# Patient Record
Sex: Male | Born: 1950 | Race: Black or African American | Hispanic: No | Marital: Married | Smoking: Current every day smoker
Health system: Southern US, Community
[De-identification: ages and names within clinical notes are randomized; demographics above are authoritative.]

## PROBLEM LIST (undated history)

## (undated) DIAGNOSIS — M542 Cervicalgia: Secondary | ICD-10-CM

## (undated) DIAGNOSIS — Z860100 Personal history of colon polyps, unspecified: Secondary | ICD-10-CM

## (undated) DIAGNOSIS — Z8669 Personal history of other diseases of the nervous system and sense organs: Secondary | ICD-10-CM

## (undated) DIAGNOSIS — R531 Weakness: Secondary | ICD-10-CM

## (undated) DIAGNOSIS — G8929 Other chronic pain: Secondary | ICD-10-CM

## (undated) DIAGNOSIS — I10 Essential (primary) hypertension: Secondary | ICD-10-CM

## (undated) DIAGNOSIS — I4891 Unspecified atrial fibrillation: Secondary | ICD-10-CM

## (undated) DIAGNOSIS — M62838 Other muscle spasm: Secondary | ICD-10-CM

## (undated) DIAGNOSIS — M549 Dorsalgia, unspecified: Secondary | ICD-10-CM

## (undated) HISTORY — DX: Other chronic pain: G89.29

## (undated) HISTORY — DX: Cervicalgia: M54.2

## (undated) HISTORY — DX: Essential (primary) hypertension: I10

## (undated) HISTORY — DX: Dorsalgia, unspecified: M54.9

## (undated) HISTORY — PX: TONSILLECTOMY: SUR1361

---

## 2000-08-23 ENCOUNTER — Encounter: Payer: Self-pay | Admitting: Family Medicine

## 2000-08-23 ENCOUNTER — Ambulatory Visit (HOSPITAL_COMMUNITY): Admission: RE | Admit: 2000-08-23 | Discharge: 2000-08-23 | Payer: Self-pay | Admitting: Family Medicine

## 2001-02-20 ENCOUNTER — Encounter: Payer: Self-pay | Admitting: Family Medicine

## 2001-02-20 ENCOUNTER — Ambulatory Visit (HOSPITAL_COMMUNITY): Admission: RE | Admit: 2001-02-20 | Discharge: 2001-02-20 | Payer: Self-pay | Admitting: Family Medicine

## 2004-05-07 ENCOUNTER — Emergency Department (HOSPITAL_COMMUNITY): Admission: EM | Admit: 2004-05-07 | Discharge: 2004-05-08 | Payer: Self-pay | Admitting: Emergency Medicine

## 2005-03-12 ENCOUNTER — Ambulatory Visit (HOSPITAL_COMMUNITY): Admission: RE | Admit: 2005-03-12 | Discharge: 2005-03-12 | Payer: Self-pay | Admitting: Family Medicine

## 2009-08-17 ENCOUNTER — Encounter: Admission: RE | Admit: 2009-08-17 | Discharge: 2009-08-17 | Payer: Self-pay | Admitting: Occupational Medicine

## 2010-12-25 ENCOUNTER — Emergency Department (HOSPITAL_COMMUNITY)
Admission: EM | Admit: 2010-12-25 | Discharge: 2010-12-25 | Disposition: A | Discharge status: Home / Self Care | Payer: Self-pay | Attending: Emergency Medicine | Admitting: Emergency Medicine

## 2010-12-25 ENCOUNTER — Encounter: Payer: Self-pay | Admitting: *Deleted

## 2010-12-25 ENCOUNTER — Other Ambulatory Visit: Payer: Self-pay

## 2010-12-25 ENCOUNTER — Emergency Department (HOSPITAL_COMMUNITY): Payer: Self-pay

## 2010-12-25 DIAGNOSIS — R1013 Epigastric pain: Secondary | ICD-10-CM | POA: Insufficient documentation

## 2010-12-25 DIAGNOSIS — F17200 Nicotine dependence, unspecified, uncomplicated: Secondary | ICD-10-CM | POA: Insufficient documentation

## 2010-12-25 DIAGNOSIS — W010XXA Fall on same level from slipping, tripping and stumbling without subsequent striking against object, initial encounter: Secondary | ICD-10-CM | POA: Insufficient documentation

## 2010-12-25 DIAGNOSIS — M7918 Myalgia, other site: Secondary | ICD-10-CM

## 2010-12-25 DIAGNOSIS — IMO0001 Reserved for inherently not codable concepts without codable children: Secondary | ICD-10-CM | POA: Insufficient documentation

## 2010-12-25 DIAGNOSIS — Z9181 History of falling: Secondary | ICD-10-CM

## 2010-12-25 DIAGNOSIS — M549 Dorsalgia, unspecified: Secondary | ICD-10-CM | POA: Insufficient documentation

## 2010-12-25 LAB — CBC
Hemoglobin: 14.9 g/dL (ref 13.0–17.0)
MCH: 32.4 pg (ref 26.0–34.0)
MCV: 93 fL (ref 78.0–100.0)
RBC: 4.6 MIL/uL (ref 4.22–5.81)

## 2010-12-25 LAB — BASIC METABOLIC PANEL
CO2: 25 mEq/L (ref 19–32)
Glucose, Bld: 81 mg/dL (ref 70–99)
Potassium: 3.6 mEq/L (ref 3.5–5.1)
Sodium: 140 mEq/L (ref 135–145)

## 2010-12-25 MED ORDER — IOHEXOL 300 MG/ML  SOLN
100.0000 mL | Freq: Once | INTRAMUSCULAR | Status: AC | PRN
  Administered 2010-12-25: 100 mL via INTRAVENOUS

## 2010-12-25 MED ORDER — HYDROCODONE-ACETAMINOPHEN 5-325 MG PO TABS: 1.0000 | ORAL_TABLET | ORAL | Status: AC | PRN

## 2010-12-25 MED ORDER — IBUPROFEN 600 MG PO TABS: 600.0000 mg | ORAL_TABLET | Freq: Four times a day (QID) | ORAL | Status: AC | PRN

## 2010-12-25 NOTE — ED Notes (Signed)
States slipped on wet substance and c/o pain to right lower back, right hip and right leg.  Also c/o chest pain.  Denies hitting head/LOC.

## 2010-12-25 NOTE — ED Notes (Signed)
Pt taken to ct. Nad.  
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
## 2011-01-01 LAB — SAMPLE TO BLOOD BANK

## 2011-01-01 NOTE — ED Provider Notes (Signed)
History     CSN: [id number] Arrival date & time: 12/25/2010  5:34 PM   Chief Complaint  Patient presents with  . Fall     (Include location/radiation/quality/duration/timing/severity/associated sxs/prior treatment) Patient is a 60 y.o. male presenting with fall. The history is provided by the patient.  Fall The accident occurred less than 1 hour ago (Also developed chest pain,  stating he may have pulled a chest muscle when he tried to grab a shelf with his right arm.Marland Kitchen He denies hitting his head.). The fall occurred while walking (He was walking through an auto parts store when he slipped in an unknown substance,  causing pain in his right lower back,  hip and leg.  ). He landed on a hard floor. The point of impact was the right hip. The pain is at a severity of 8/10. The pain is moderate. He was ambulatory at the scene. Pertinent negatives include no visual change, no fever, no numbness, no abdominal pain, no nausea, no headaches and no loss of consciousness. The symptoms are aggravated by activity. He has tried nothing for the symptoms.     History reviewed. No pertinent past medical history.   History reviewed. No pertinent past surgical history.  No family history on file.  History  Substance Use Topics  . Smoking status: Current Everyday Smoker    Types: Cigarettes  . Smokeless tobacco: Not on file  . Alcohol Use: Yes     occasional      Review of Systems  Constitutional: Negative for fever.  HENT: Negative for congestion, sore throat and neck pain.   Eyes: Negative.   Respiratory: Negative for chest tightness and shortness of breath.   Cardiovascular: Negative for chest pain.  Gastrointestinal: Negative for nausea and abdominal pain.  Genitourinary: Negative.   Musculoskeletal: Positive for back pain and arthralgias. Negative for joint swelling.  Skin: Negative.  Negative for rash and wound.  Neurological: Negative for dizziness, loss of consciousness, weakness,  light-headedness, numbness and headaches.  Hematological: Negative.   Psychiatric/Behavioral: Negative.     Allergies  Review of patient's allergies indicates no known allergies.  Home Medications   Current Outpatient Rx  Name Route Sig Dispense Refill  . HYDROCODONE-ACETAMINOPHEN 5-325 MG PO TABS Oral Take 1 tablet by mouth every 4 (four) hours as needed for pain. 15 tablet 0  . IBUPROFEN 600 MG PO TABS Oral Take 1 tablet (600 mg total) by mouth every 6 (six) hours as needed for pain. 30 tablet 0    Physical Exam    BP 162/80  Pulse 80  Temp(Src) 98.2 F (36.8 C) (Oral)  Resp 20  Ht 5\' 11"  (1.803 m)  Wt 175 lb (79.379 kg)  BMI 24.41 kg/m2  SpO2 99%  Physical Exam  Nursing note and vitals reviewed. Constitutional: He is oriented to person, place, and time. He appears well-developed and well-nourished.  HENT:  Head: Normocephalic and atraumatic.  Eyes: Conjunctivae are normal.  Neck: Normal range of motion.  Cardiovascular: Normal rate, regular rhythm, normal heart sounds and intact distal pulses.   Pulmonary/Chest: Effort normal and breath sounds normal. He has no wheezes. He exhibits no tenderness.  Abdominal: Soft. Bowel sounds are normal. He exhibits no distension. There is no hepatosplenomegaly. There is tenderness in the epigastric area. There is no rebound, no guarding and no CVA tenderness. No hernia.    Musculoskeletal: Normal range of motion.       Right hip: He exhibits tenderness. He exhibits normal range of  motion, normal strength, no swelling and no deformity.       Lumbar back: He exhibits tenderness and pain. He exhibits normal range of motion, no swelling, no edema and no spasm.  Neurological: He is alert and oriented to person, place, and time.  Skin: Skin is warm and dry.  Psychiatric: He has a normal mood and affect.    ED Course  Procedures  Results for orders placed during the hospital encounter of 12/25/10  BASIC METABOLIC PANEL       Component Value Range   Sodium 140  135 - 145 (mEq/L)   Potassium 3.6  3.5 - 5.1 (mEq/L)   Chloride 104  96 - 112 (mEq/L)   CO2 25  19 - 32 (mEq/L)   Glucose, Bld 81  70 - 99 (mg/dL)   BUN 15  6 - 23 (mg/dL)   Creatinine, Ser 1.61  0.50 - 1.35 (mg/dL)   Calcium 8.9  8.4 - 09.6 (mg/dL)   GFR calc non Af Amer >60  >60 (mL/min)   GFR calc Af Amer >60  >60 (mL/min)  CBC      Component Value Range   WBC 9.7  4.0 - 10.5 (K/uL)   RBC 4.60  4.22 - 5.81 (MIL/uL)   Hemoglobin 14.9  13.0 - 17.0 (g/dL)   HCT 04.5  40.9 - 81.1 (%)   MCV 93.0  78.0 - 100.0 (fL)   MCH 32.4  26.0 - 34.0 (pg)   MCHC 34.8  30.0 - 36.0 (g/dL)   RDW 91.4  78.2 - 95.6 (%)   Platelets 218  150 - 400 (K/uL)  SAMPLE TO BLOOD BANK      Component Value Range   Blood Bank Specimen SAMPLE AVAILABLE FOR TESTING     Sample Expiration 01/01/2011     Ct Chest W Contrast  12/25/2010  *RADIOLOGY REPORT*  Clinical Data:  Fall.  Right-sided chest pain and right flank pain.  CT CHEST, ABDOMEN AND PELVIS WITH CONTRAST 12/25/2010:  Technique:  Multidetector CT imaging of the chest, abdomen and pelvis was performed following the standard protocol during bolus administration of intravenous contrast.  Contrast: 100 ml Omnipaque-300 IV.  Comparison:  None.  CT CHEST  Findings:  No evidence of mediastinal hematoma.  Heart size upper normal.  No pericardial effusion.  No visible coronary artery calcification.  Minimal atherosclerosis involving the thoracic aorta.  Emphysematous changes in both lungs.  Expected dependent atelectasis posteriorly in both lower lobes.  Minimal linear scarring in the lower lobes and lingula.  Lungs otherwise clear. No pleural effusions.  No pneumothorax.  No significant mediastinal, hilar, or axillary lymphadenopathy. Visualized thyroid gland unremarkable.  Bone window images demonstrate no fractures involving the bony thorax.  IMPRESSION:  1.  No acute traumatic injury to the thorax. 2.  Mild COPD/emphysema.  No acute  cardiopulmonary disease.  CT ABDOMEN AND PELVIS  Findings:  No evidence of acute traumatic injury to the abdominal or pelvic viscera.  Approximate 1 cm lesion in the anterior segment right lobe of liver (series 2, image 58), not visualized on the delayed images.  No other focal hepatic parenchymal abnormality. Normal appearing spleen, pancreas, adrenal glands, and kidneys. Gallbladder unremarkable by CT.  No biliary ductal dilation. Moderate aorto-iliofemoral atherosclerosis.  No significant lymphadenopathy.  Normal-appearing stomach, small bowel, and colon.  Normal appendix in the right upper pelvis.  No ascites.  Urinary bladder unremarkable.  Normal prostate gland and seminal vesicles for age. Calcifications involving the cavernosa of  the penis, likely dystrophic.  Small right scrotal hydrocele.  Bone window images demonstrate degenerative changes throughout the lumbar spine but no acute fractures involving the lumbar spine or pelvis.  IMPRESSION:  1.  No acute traumatic injury to the abdominal or pelvic viscera. 2.  Approximate 1 cm hemangioma in the right lobe of the liver.  Original Report Authenticated By: Arnell Sieving, M.D.   Ct Abdomen Pelvis W Contrast  12/25/2010  *RADIOLOGY REPORT*  Clinical Data:  Fall.  Right-sided chest pain and right flank pain.  CT CHEST, ABDOMEN AND PELVIS WITH CONTRAST 12/25/2010:  Technique:  Multidetector CT imaging of the chest, abdomen and pelvis was performed following the standard protocol during bolus administration of intravenous contrast.  Contrast: 100 ml Omnipaque-300 IV.  Comparison:  None.  CT CHEST  Findings:  No evidence of mediastinal hematoma.  Heart size upper normal.  No pericardial effusion.  No visible coronary artery calcification.  Minimal atherosclerosis involving the thoracic aorta.  Emphysematous changes in both lungs.  Expected dependent atelectasis posteriorly in both lower lobes.  Minimal linear scarring in the lower lobes and lingula.  Lungs  otherwise clear. No pleural effusions.  No pneumothorax.  No significant mediastinal, hilar, or axillary lymphadenopathy. Visualized thyroid gland unremarkable.  Bone window images demonstrate no fractures involving the bony thorax.  IMPRESSION:  1.  No acute traumatic injury to the thorax. 2.  Mild COPD/emphysema.  No acute cardiopulmonary disease.  CT ABDOMEN AND PELVIS  Findings:  No evidence of acute traumatic injury to the abdominal or pelvic viscera.  Approximate 1 cm lesion in the anterior segment right lobe of liver (series 2, image 58), not visualized on the delayed images.  No other focal hepatic parenchymal abnormality. Normal appearing spleen, pancreas, adrenal glands, and kidneys. Gallbladder unremarkable by CT.  No biliary ductal dilation. Moderate aorto-iliofemoral atherosclerosis.  No significant lymphadenopathy.  Normal-appearing stomach, small bowel, and colon.  Normal appendix in the right upper pelvis.  No ascites.  Urinary bladder unremarkable.  Normal prostate gland and seminal vesicles for age. Calcifications involving the cavernosa of the penis, likely dystrophic.  Small right scrotal hydrocele.  Bone window images demonstrate degenerative changes throughout the lumbar spine but no acute fractures involving the lumbar spine or pelvis.  IMPRESSION:  1.  No acute traumatic injury to the abdominal or pelvic viscera. 2.  Approximate 1 cm hemangioma in the right lobe of the liver.  Original Report Authenticated By: Arnell Sieving, M.D.     1. Myofascial pain   2. History of fall      MDM Discussed with Dr. Lynelle Doctor prior to ordering CT scan.     Myofascial strain with history of fall.  INcidental liver hemangioma.  Discussed this finding with Dr. Lyman Bishop.  No followup studies recommended.       Candis Musa, PA 01/01/11 1414

## 2011-01-09 NOTE — ED Provider Notes (Signed)
Medical screening examination/treatment/procedure(s) were performed by non-physician practitioner and as supervising physician I was immediately available for consultation/collaboration. Devoria Albe, MD, FACEP    Ward Givens, MD 01/09/11 [phone]

## 2013-02-12 ENCOUNTER — Encounter (HOSPITAL_COMMUNITY): Payer: Self-pay | Admitting: Emergency Medicine

## 2013-02-12 ENCOUNTER — Emergency Department (HOSPITAL_COMMUNITY): Payer: BC Managed Care – PPO

## 2013-02-12 ENCOUNTER — Emergency Department (HOSPITAL_COMMUNITY)
Admission: EM | Admit: 2013-02-12 | Discharge: 2013-02-12 | Disposition: A | Discharge status: Home / Self Care | Payer: BC Managed Care – PPO | Attending: Emergency Medicine | Admitting: Emergency Medicine

## 2013-02-12 DIAGNOSIS — F17200 Nicotine dependence, unspecified, uncomplicated: Secondary | ICD-10-CM | POA: Insufficient documentation

## 2013-02-12 DIAGNOSIS — Y9389 Activity, other specified: Secondary | ICD-10-CM | POA: Insufficient documentation

## 2013-02-12 DIAGNOSIS — S0990XA Unspecified injury of head, initial encounter: Secondary | ICD-10-CM | POA: Insufficient documentation

## 2013-02-12 DIAGNOSIS — Y92410 Unspecified street and highway as the place of occurrence of the external cause: Secondary | ICD-10-CM | POA: Insufficient documentation

## 2013-02-12 DIAGNOSIS — IMO0002 Reserved for concepts with insufficient information to code with codable children: Secondary | ICD-10-CM | POA: Insufficient documentation

## 2013-02-12 DIAGNOSIS — M549 Dorsalgia, unspecified: Secondary | ICD-10-CM

## 2013-02-12 DIAGNOSIS — S0993XA Unspecified injury of face, initial encounter: Secondary | ICD-10-CM | POA: Insufficient documentation

## 2013-02-12 DIAGNOSIS — M542 Cervicalgia: Secondary | ICD-10-CM

## 2013-02-12 DIAGNOSIS — N179 Acute kidney failure, unspecified: Secondary | ICD-10-CM | POA: Insufficient documentation

## 2013-02-12 LAB — CBC WITH DIFFERENTIAL/PLATELET
Eosinophils Absolute: 0.2 10*3/uL (ref 0.0–0.7)
Hemoglobin: 14.6 g/dL (ref 13.0–17.0)
Lymphocytes Relative: 24 % (ref 12–46)
Lymphs Abs: 1.9 10*3/uL (ref 0.7–4.0)
MCH: 32.6 pg (ref 26.0–34.0)
MCV: 94.4 fL (ref 78.0–100.0)
Monocytes Relative: 11 % (ref 3–12)
Neutrophils Relative %: 62 % (ref 43–77)
RBC: 4.48 MIL/uL (ref 4.22–5.81)

## 2013-02-12 LAB — BASIC METABOLIC PANEL
BUN: 22 mg/dL (ref 6–23)
CO2: 24 mEq/L (ref 19–32)
Chloride: 101 mEq/L (ref 96–112)
GFR calc non Af Amer: 53 mL/min — ABNORMAL LOW (ref >90)
Glucose, Bld: 101 mg/dL — ABNORMAL HIGH (ref 70–99)
Potassium: 3.5 mEq/L (ref 3.5–5.1)

## 2013-02-12 MED ORDER — CYCLOBENZAPRINE HCL 10 MG PO TABS: 10.0000 mg | ORAL_TABLET | Freq: Two times a day (BID) | ORAL | Status: DC | PRN

## 2013-02-12 MED ORDER — HYDROCODONE-ACETAMINOPHEN 5-325 MG PO TABS
2.0000 | ORAL_TABLET | ORAL | Status: DC | PRN
Start: 2013-02-12 — End: 2013-05-29

## 2013-02-12 MED ORDER — MORPHINE SULFATE 4 MG/ML IJ SOLN
6.0000 mg | Freq: Once | INTRAMUSCULAR | Status: AC
  Administered 2013-02-12: 6 mg via INTRAVENOUS
  Filled 2013-02-12: qty 2

## 2013-02-12 MED ORDER — FENTANYL CITRATE 0.05 MG/ML IJ SOLN
75.0000 ug | Freq: Once | INTRAMUSCULAR | Status: AC
  Administered 2013-02-12: 75 ug via INTRAVENOUS
  Filled 2013-02-12: qty 2
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
## 2013-02-12 NOTE — Progress Notes (Signed)
ED/CM noted patient did not have health insurance and/or PCP listed in the computer.  Patient was given the [place] resource handout with information on the clinics, food pantries, and the handout for new health insurance sign-up.  Patient expressed appreciation for this. 

## 2013-02-12 NOTE — ED Notes (Signed)
Pt alert & oriented x4, stable gait. Patient given discharge instructions, paperwork & prescription(s). Patient  instructed to stop at the registration desk to finish any additional paperwork. Patient verbalized understanding. Pt left department w/ no further questions. 

## 2013-02-12 NOTE — ED Notes (Signed)
Pt updated on wait status, pt denies pain.

## 2013-02-23 ENCOUNTER — Ambulatory Visit (HOSPITAL_COMMUNITY)
Admission: RE | Admit: 2013-02-23 | Discharge: 2013-02-23 | Disposition: A | Discharge status: Home / Self Care | Payer: No Typology Code available for payment source | Source: Ambulatory Visit | Attending: Family Medicine | Admitting: Family Medicine

## 2013-02-23 DIAGNOSIS — M549 Dorsalgia, unspecified: Secondary | ICD-10-CM

## 2013-02-23 DIAGNOSIS — M545 Low back pain, unspecified: Secondary | ICD-10-CM | POA: Insufficient documentation

## 2013-02-23 DIAGNOSIS — IMO0001 Reserved for inherently not codable concepts without codable children: Secondary | ICD-10-CM | POA: Insufficient documentation

## 2013-02-23 DIAGNOSIS — R29898 Other symptoms and signs involving the musculoskeletal system: Secondary | ICD-10-CM

## 2013-02-23 DIAGNOSIS — M2560 Stiffness of unspecified joint, not elsewhere classified: Secondary | ICD-10-CM | POA: Insufficient documentation

## 2013-02-23 DIAGNOSIS — M6281 Muscle weakness (generalized): Secondary | ICD-10-CM | POA: Insufficient documentation

## 2013-02-23 DIAGNOSIS — M539 Dorsopathy, unspecified: Secondary | ICD-10-CM | POA: Insufficient documentation
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
## 2013-02-25 ENCOUNTER — Ambulatory Visit (HOSPITAL_COMMUNITY)
Admission: RE | Admit: 2013-02-25 | Discharge: 2013-02-25 | Disposition: A | Discharge status: Home / Self Care | Payer: No Typology Code available for payment source | Source: Ambulatory Visit | Attending: *Deleted | Admitting: *Deleted

## 2013-02-25 NOTE — Progress Notes (Signed)
Physical Therapy Treatment Patient Details  Name: Wayne Alvarez MRN: [id number] Date of Birth: 1950-10-05  Today's Date: 02/25/2013 Time: 0850-0930 PT Time Calculation (min): 40 min  Visit#: 2 of 12  Re-eval: 03/25/13  Authorization: [employer]  Charges;  therex [phone] ([phone]'), Moist heat with therex  Subjective: Symptoms/Limitations Symptoms: Pt states he continues to hurt down Rt LE and UE.  States he's been trying to do his HEP.  Pt moving very slowly, antalgic today. Pain Assessment Currently in Pain?: Yes Pain Score: 8  Pain Location: Back Pain Radiating Towards: Rt foot and Rt UE   Exercise/Treatments Stretches Single Knee to Chest Stretch: 3 reps;20 seconds Lower Trunk Rotation: 5 reps Standing Other Standing Lumbar Exercises: lumbar extension 5 reps Supine Ab Set: 10 reps Glut Set: 10 reps Bridge: 5 reps Straight Leg Raise: 5 reps Other Supine Lumbar Exercises: adductor set x 10   Modalities Modalities: Moist Heat Moist Heat Therapy Number Minutes Moist Heat: 25 Minutes Moist Heat Location: Other (comment)  Physical Therapy Assessment and Plan PT Assessment and Plan Clinical Impression Statement: Pt with slow, antalgic movements all during session, frequently grabbing Rt LE during therex.  Tends to keep Rt LE extended as states it feels better.  Added moist heat to exercises performed in supine.  Pt able to complete, though reactive with increased time.  Encouraged pt to perform therex at home and use heat as needed.  May benefit from prone extension as well as addition of traction if pain continues (xray shows several bulging disks).  Pt initially reported pain reduction, however when began walking out stated his pain was higher at 8.5/10 PT Plan: Continue to progress lumbar stabilization and overall pain reduction.     Problem List Patient Active Problem List   Diagnosis Date Noted  . Back pain 02/23/2013  . Stiffness of joints, not elsewhere classified,  multiple sites 02/23/2013  . Weakness of both legs 02/23/2013    PT - End of Session Activity Tolerance: Patient limited by pain   Lurena Nida, PTA/CLT 02/25/2013, 9:45 AM

## 2013-03-02 ENCOUNTER — Ambulatory Visit (HOSPITAL_COMMUNITY)
Admission: RE | Admit: 2013-03-02 | Discharge: 2013-03-02 | Disposition: A | Discharge status: Home / Self Care | Payer: No Typology Code available for payment source | Source: Ambulatory Visit | Attending: *Deleted | Admitting: *Deleted

## 2013-03-02 NOTE — Progress Notes (Signed)
Physical Therapy Treatment Patient Details  Name: Wayne Alvarez MRN: [id number] Date of Birth: 1951-04-12  Today's Date: 03/02/2013 Time: 1022-1104 PT Time Calculation (min): 42 min Charges: Therex x 15' Manual x 20' MHP x 1  Visit#: 3 of 12  Re-eval: 03/25/13  Authorization: [employer]   Subjective: Symptoms/Limitations Symptoms: Pt sates that he is very sore today. He reports continued HEP compliance. Pain Assessment Currently in Pain?: Yes Pain Score: 8  Pain Location: Back Pain Orientation: Right  Exercise/Treatments Stretches Passive Hamstring Stretch: 2 reps;30 seconds Single Knee to Chest Stretch: 3 reps;30 seconds;Limitations Single Knee to Chest Stretch Limitations: Passive Lower Trunk Rotation: 5 reps;10 seconds Supine Ab Set: 10 reps Glut Set: 10 reps  Modalities Modalities: Moist Heat Moist Heat Therapy Number Minutes Moist Heat:  (Throughout session) Moist Heat Location: Other (comment) (Lumbar)  Physical Therapy Assessment and Plan PT Assessment and Plan Clinical Impression Statement: Pt is limited by pain. Treatment focus on decreasing pain and improving mobility. Pt appears to receive relief from extension. Educated pt on complete POE at home to decrease pain/radicular symptoms. PT Plan: Continue to progress lumbar stabilization and overall pain reduction.     Problem List Patient Active Problem List   Diagnosis Date Noted  . Back pain 02/23/2013  . Stiffness of joints, not elsewhere classified, multiple sites 02/23/2013  . Weakness of both legs 02/23/2013    PT - End of Session Activity Tolerance: Patient limited by pain General Behavior During Therapy: [facility] for tasks assessed/performed  Seth Bake, PTA  03/02/2013, 12:28 PM

## 2013-03-04 ENCOUNTER — Ambulatory Visit (HOSPITAL_COMMUNITY)
Admission: RE | Admit: 2013-03-04 | Discharge: 2013-03-04 | Disposition: A | Discharge status: Home / Self Care | Payer: No Typology Code available for payment source | Source: Ambulatory Visit | Attending: *Deleted | Admitting: *Deleted

## 2013-03-06 ENCOUNTER — Ambulatory Visit (HOSPITAL_COMMUNITY)
Admission: RE | Admit: 2013-03-06 | Discharge: 2013-03-06 | Disposition: A | Discharge status: Home / Self Care | Payer: No Typology Code available for payment source | Source: Ambulatory Visit | Attending: *Deleted | Admitting: *Deleted

## 2013-03-06 DIAGNOSIS — R29898 Other symptoms and signs involving the musculoskeletal system: Secondary | ICD-10-CM

## 2013-03-06 DIAGNOSIS — M549 Dorsalgia, unspecified: Secondary | ICD-10-CM

## 2013-03-06 DIAGNOSIS — M2560 Stiffness of unspecified joint, not elsewhere classified: Secondary | ICD-10-CM

## 2013-03-06 NOTE — Progress Notes (Signed)
Physical Therapy Treatment Patient Details  Name: Wayne Alvarez MRN: [id number] Date of Birth: 10-17-1950  Today's Date: 03/06/2013 Time: 4098-1191 PT Time Calculation (min): 48 min Charge: Therex 38', Moist heat 45' (during supine therex and 10 minutes at end of session for pain control.)  Visit#: 4 of 12  Re-eval: 03/25/13 Assessment Diagnosis: lumbar pain Next MD Visit: Deloris Ping, [facility] on 03/09/2013 Prior Therapy: none  Authorization: [employer]  Authorization Time Period:    Authorization Visit#:   of     Subjective: Symptoms/Limitations Symptoms: Pt reports prone exercises, compliance with HEP daily for 30 minutes.  Pain scale LBP 7 04/17/08 today, reports relief with MHP.   Pain Assessment Currently in Pain?: Yes Pain Score: 8  Pain Location: Back Pain Orientation: Upper;Lower Pain Radiating Towards: Rt LE down to dorsal toes  Objective:   Exercise/Treatments Mobility/Balance  Bed Mobility Bed Mobility: Right Sidelying to Sit;Left Sidelying to Sit Right Sidelying to Sit: 5: Supervision Left Sidelying to Sit: 5: Supervision     Stretches Passive Hamstring Stretch: 3 reps;30 seconds Lower Trunk Rotation: 5 reps;10 seconds Hip Flexor Stretch: 1 rep;30 seconds;Limitations Hip Flexor Stretch Limitations: supine edge of mat Prone on Elbows Stretch: 1 rep;60 seconds;Limitations Prone on Elbows Stretch Limitations: increased pain Seated Other Seated Lumbar Exercises: dynadisc anterior/posterior rotation Supine Ab Set: 10 reps;5 seconds;Limitations AB Set Limitations: facilitation for PFC Glut Set: 10 reps;5 seconds Bridge: Limitations Bridge Limitations: 2 reps, limited by pain  Modalities Modalities: Moist Heat Manual Therapy Manual Therapy: Other (comment) Other Manual Therapy: SI within alingment. Moist Heat Therapy Moist Heat Location: Other (comment) (Supine therex)  Physical Therapy Assessment and Plan PT Assessment and Plan Clinical  Impression Statement: Pt continues to be limited by pain.  Treatment focus on decreasing pain and improving mobility.  Pt instructed proper technique getting in and out of bed to reduce stress on lower back.  Pt appears to have relief with extension of LE.  Pt exhibited facial and verbal expression of pain,observed continuous rubbing for Rt LE through session.  Began hip mobitliy exercises with PT facilitation to improve movements.  Sacroiliac joint within alignment, no muscle energy techniques required.   PT Plan: Continue to progress lumbar stabilization and overall pain reduction.    Goals Home Exercise Program Pt/caregiver will Perform Home Exercise Program: For increased ROM;For increased strengthening PT Short Term Goals Time to Complete Short Term Goals: 2 weeks PT Short Term Goal 1: Pain to be no greater than a 6/10 80% of the day PT Short Term Goal 1 - Progress: Progressing toward goal PT Short Term Goal 2: Pt to be able to sit for 40 min with comfort PT Short Term Goal 3: Pt to be able to walk for 30 mintues with comfort PT Long Term Goals Time to Complete Long Term Goals: 4 weeks PT Long Term Goal 1: I in advance HEP PT Long Term Goal 2: Pt pain to be no greater than a 4/10 80% of the day Long Term Goal 3: Pt strength to be improved by one grade to allow pt pain to decrease Long Term Goal 4: Pt to be able to FB limited only 20% to allow pt to get into lower cabinets in bathroom/kitchen PT Long Term Goal 5: Pt to be able to walk for an hour to do shopping  Problem List Patient Active Problem List   Diagnosis Date Noted  . Back pain 02/23/2013  . Stiffness of joints, not elsewhere classified, multiple sites 02/23/2013  .  Weakness of both legs 02/23/2013    PT - End of Session Activity Tolerance: Patient limited by pain General Behavior During Therapy: [facility] for tasks assessed/performed  GP    Juel Burrow 03/06/2013, 12:40 PM

## 2013-03-09 ENCOUNTER — Inpatient Hospital Stay (HOSPITAL_COMMUNITY): Admission: RE | Admit: 2013-03-09 | Payer: BC Managed Care – PPO | Source: Ambulatory Visit

## 2013-03-16 ENCOUNTER — Inpatient Hospital Stay (HOSPITAL_COMMUNITY)
Admission: RE | Admit: 2013-03-16 | Payer: BC Managed Care – PPO | Source: Ambulatory Visit | Admitting: Physical Therapy

## 2013-03-18 ENCOUNTER — Ambulatory Visit (HOSPITAL_COMMUNITY): Payer: BC Managed Care – PPO | Admitting: Physical Therapy

## 2013-03-20 ENCOUNTER — Inpatient Hospital Stay (HOSPITAL_COMMUNITY)
Admission: RE | Admit: 2013-03-20 | Payer: BC Managed Care – PPO | Source: Ambulatory Visit | Admitting: Physical Therapy

## 2013-03-30 ENCOUNTER — Ambulatory Visit (INDEPENDENT_AMBULATORY_CARE_PROVIDER_SITE_OTHER): Payer: BC Managed Care – PPO | Admitting: Gastroenterology

## 2013-03-30 ENCOUNTER — Other Ambulatory Visit: Payer: Self-pay | Admitting: Internal Medicine

## 2013-03-30 ENCOUNTER — Encounter (INDEPENDENT_AMBULATORY_CARE_PROVIDER_SITE_OTHER): Payer: Self-pay

## 2013-03-30 ENCOUNTER — Encounter: Payer: Self-pay | Admitting: Gastroenterology

## 2013-03-30 VITALS — BP 151/78 | HR 67 | Temp 97.3°F | Ht 71.0 in | Wt 177.8 lb

## 2013-03-30 DIAGNOSIS — Z1211 Encounter for screening for malignant neoplasm of colon: Secondary | ICD-10-CM

## 2013-03-30 DIAGNOSIS — R195 Other fecal abnormalities: Secondary | ICD-10-CM | POA: Insufficient documentation

## 2013-03-30 MED ORDER — PEG 3350-KCL-NA BICARB-NACL 420 G PO SOLR: 4000.0000 mL | ORAL | Status: DC
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
## 2013-03-30 NOTE — Assessment & Plan Note (Signed)
62 year old male with heme positive stool but no other concerning upper or lower GI symptoms. No prior colonoscopy, no family history of colon cancer. Likely benign but needs initial screening colonoscopy in near future.   Proceed with TCS with Dr. Jena Gauss in near future: the risks, benefits, and alternatives have been discussed with the patient in detail. The patient states understanding and desires to proceed.

## 2013-03-30 NOTE — Patient Instructions (Signed)
We have scheduled you for a colonoscopy with Dr. Jena Gauss in the near future.   Further recommendations to follow.  Have a [place] Christmas!

## 2013-03-30 NOTE — Progress Notes (Signed)
No pcp

## 2013-04-03 ENCOUNTER — Encounter (HOSPITAL_COMMUNITY): Payer: Self-pay | Admitting: Pharmacy Technician

## 2013-04-20 ENCOUNTER — Encounter (HOSPITAL_COMMUNITY): Payer: Self-pay | Admitting: Emergency Medicine

## 2013-04-20 ENCOUNTER — Ambulatory Visit (HOSPITAL_COMMUNITY)
Admission: RE | Admit: 2013-04-20 | Discharge: 2013-04-20 | Disposition: A | Discharge status: Home Health | Payer: BC Managed Care – PPO | Source: Ambulatory Visit | Attending: Internal Medicine | Admitting: Internal Medicine

## 2013-04-20 ENCOUNTER — Encounter (HOSPITAL_COMMUNITY)
Admission: RE | Disposition: A | Discharge status: Home Health | Payer: Self-pay | Source: Ambulatory Visit | Attending: Internal Medicine

## 2013-04-20 DIAGNOSIS — D126 Benign neoplasm of colon, unspecified: Secondary | ICD-10-CM | POA: Insufficient documentation

## 2013-04-20 DIAGNOSIS — Q438 Other specified congenital malformations of intestine: Secondary | ICD-10-CM | POA: Insufficient documentation

## 2013-04-20 DIAGNOSIS — I10 Essential (primary) hypertension: Secondary | ICD-10-CM | POA: Insufficient documentation

## 2013-04-20 DIAGNOSIS — R195 Other fecal abnormalities: Secondary | ICD-10-CM

## 2013-04-20 DIAGNOSIS — Z1211 Encounter for screening for malignant neoplasm of colon: Secondary | ICD-10-CM

## 2013-04-20 HISTORY — PX: COLONOSCOPY: SHX5424

## 2013-04-20 SURGERY — COLONOSCOPY
Anesthesia: Moderate Sedation

## 2013-04-20 MED ORDER — ONDANSETRON HCL 4 MG/2ML IJ SOLN
INTRAMUSCULAR | Status: AC
  Filled 2013-04-20: qty 2

## 2013-04-20 MED ORDER — SODIUM CHLORIDE 0.9 % IJ SOLN
INTRAMUSCULAR | Status: AC
  Filled 2013-04-20: qty 10

## 2013-04-20 MED ORDER — SODIUM CHLORIDE 0.9 % IV SOLN
INTRAVENOUS | Status: DC
  Administered 2013-04-20: 13:00:00 via INTRAVENOUS

## 2013-04-20 MED ORDER — PROMETHAZINE HCL 25 MG/ML IJ SOLN
25.0000 mg | Freq: Once | INTRAMUSCULAR | Status: AC
  Administered 2013-04-20: 25 mg via INTRAVENOUS

## 2013-04-20 MED ORDER — STERILE WATER FOR IRRIGATION IR SOLN
Status: DC | PRN
  Administered 2013-04-20: 13:00:00

## 2013-04-20 MED ORDER — MEPERIDINE HCL 100 MG/ML IJ SOLN
INTRAMUSCULAR | Status: DC | PRN
  Administered 2013-04-20: 50 mg via INTRAVENOUS

## 2013-04-20 MED ORDER — ONDANSETRON HCL 4 MG/2ML IJ SOLN
INTRAMUSCULAR | Status: DC | PRN
  Administered 2013-04-20: 4 mg via INTRAVENOUS

## 2013-04-20 MED ORDER — MIDAZOLAM HCL 5 MG/5ML IJ SOLN
INTRAMUSCULAR | Status: DC | PRN
  Administered 2013-04-20: 2 mg via INTRAVENOUS
  Administered 2013-04-20: 1 mg via INTRAVENOUS

## 2013-04-20 MED ORDER — MEPERIDINE HCL 100 MG/ML IJ SOLN
INTRAMUSCULAR | Status: AC
  Filled 2013-04-20: qty 2

## 2013-04-20 MED ORDER — PROMETHAZINE HCL 25 MG/ML IJ SOLN
INTRAMUSCULAR | Status: AC
  Filled 2013-04-20: qty 1

## 2013-04-20 MED ORDER — MIDAZOLAM HCL 5 MG/5ML IJ SOLN
INTRAMUSCULAR | Status: AC
  Filled 2013-04-20: qty 10
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
## 2013-04-20 NOTE — Interval H&P Note (Signed)
History and Physical Interval Note:  04/20/2013 1:01 PM  Wayne Alvarez  has presented today for surgery, with the diagnosis of SCREENING COLONOSCOPY  The various methods of treatment have been discussed with the patient and family. After consideration of risks, benefits and other options for treatment, the patient has consented to  Procedure(s) with comments: COLONOSCOPY (N/A) - 1:00 as a surgical intervention .  The patient's history has been reviewed, patient examined, no change in status, stable for surgery.  I have reviewed the patient's chart and labs.  Questions were answered to the patient's satisfaction.     Heme positive stool. Clinically, no GI bleeding. Normal hemoglobin. No upper GI tract symptoms or other GI tract symptoms for that matter. Diagnostic colonoscopy today per plan.The risks, benefits, limitations, alternatives and imponderables have been reviewed with the patient. Questions have been answered. All parties are agreeable.   Wayne Alvarez

## 2013-04-20 NOTE — Discharge Instructions (Addendum)
Colonoscopy Discharge Instructions  Read the instructions outlined below and refer to this sheet in the next few weeks. These discharge instructions provide you with general information on caring for yourself after you leave the hospital. Your doctor may also give you specific instructions. While your treatment has been planned according to the most current medical practices available, unavoidable complications occasionally occur. If you have any problems or questions after discharge, call Dr. Gala Romney at [phone]. ACTIVITY  You may resume your regular activity, but move at a slower pace for the next 24 hours.   Take frequent rest periods for the next 24 hours.   Walking will help get rid of the air and reduce the bloated feeling in your belly (abdomen).   No driving for 24 hours (because of the medicine (anesthesia) used during the test).    Do not sign any important legal documents or operate any machinery for 24 hours (because of the anesthesia used during the test).  NUTRITION  Drink plenty of fluids.   You may resume your normal diet as instructed by your doctor.   Begin with a light meal and progress to your normal diet. Heavy or fried foods are harder to digest and may make you feel sick to your stomach (nauseated).   Avoid alcoholic beverages for 24 hours or as instructed.  MEDICATIONS  You may resume your normal medications unless your doctor tells you otherwise.  WHAT YOU CAN EXPECT TODAY  Some feelings of bloating in the abdomen.   Passage of more gas than usual.   Spotting of blood in your stool or on the toilet paper.  IF YOU HAD POLYPS REMOVED DURING THE COLONOSCOPY:  No aspirin products for 7 days or as instructed.   No alcohol for 7 days or as instructed.   Eat a soft diet for the next 24 hours.  FINDING OUT THE RESULTS OF YOUR TEST Not all test results are available during your visit. If your test results are not back during the visit, make an appointment  with your caregiver to find out the results. Do not assume everything is normal if you have not heard from your caregiver or the medical facility. It is important for you to follow up on all of your test results.  SEEK IMMEDIATE MEDICAL ATTENTION IF:  You have more than a spotting of blood in your stool.   Your belly is swollen (abdominal distention).   You are nauseated or vomiting.   You have a temperature over 101.   You have abdominal pain or discomfort that is severe or gets worse throughout the day.   Polyp information provided  Further recommendations to follow pending review of pathology report  Colon Polyps Polyps are lumps of extra tissue growing inside the body. Polyps can grow in the large intestine (colon). Most colon polyps are noncancerous (benign). However, some colon polyps can become cancerous over time. Polyps that are larger than a pea may be harmful. To be safe, caregivers remove and test all polyps. CAUSES  Polyps form when mutations in the genes cause your cells to grow and divide even though no more tissue is needed. RISK FACTORS There are a number of risk factors that can increase your chances of getting colon polyps. They include:  Being older than 50 years.  Family history of colon polyps or colon cancer.  Long-term colon diseases, such as colitis or Crohn disease.  Being overweight.  Smoking.  Being inactive.  Drinking too much alcohol. SYMPTOMS  Most small polyps do not cause symptoms. If symptoms are present, they may include: °· Blood in the stool. The stool may look dark red or black. °· Constipation or diarrhea that lasts longer than 1 week. °DIAGNOSIS °People often do not know they have polyps until their caregiver finds them during a regular checkup. Your caregiver can use 4 tests to check for polyps: °· Digital rectal exam. The caregiver wears gloves and feels inside the rectum. This test would find polyps only in the rectum. °· Barium enema.  The caregiver puts a liquid called barium into your rectum before taking X-rays of your colon. Barium makes your colon look white. Polyps are dark, so they are easy to see in the X-ray pictures. °· Sigmoidoscopy. A thin, flexible tube (sigmoidoscope) is placed into your rectum. The sigmoidoscope has a light and tiny camera in it. The caregiver uses the sigmoidoscope to look at the last third of your colon. °· Colonoscopy. This test is like sigmoidoscopy, but the caregiver looks at the entire colon. This is the most common method for finding and removing polyps. °TREATMENT  °Any polyps will be removed during a sigmoidoscopy or colonoscopy. The polyps are then tested for cancer. °PREVENTION  °To help lower your risk of getting more colon polyps: °· Eat plenty of fruits and vegetables. Avoid eating fatty foods. °· Do not smoke. °· Avoid drinking alcohol. °· Exercise every day. °· Lose weight if recommended by your caregiver. °· Eat plenty of calcium and folate. Foods that are rich in calcium include milk, cheese, and broccoli. Foods that are rich in folate include chickpeas, kidney beans, and spinach. °HOME CARE INSTRUCTIONS °Keep all follow-up appointments as directed by your caregiver. You may need periodic exams to check for polyps. °SEEK MEDICAL CARE IF: °You notice bleeding during a bowel movement. °Document Released: 12/28/2003 Document Revised: 06/25/2011 Document Reviewed: 06/12/2011 °ExitCare® Patient Information ©2014 [employer]. ° °

## 2013-04-20 NOTE — Op Note (Addendum)
[facility] [address] [place], [address]   COLONOSCOPY PROCEDURE REPORT  PATIENT: Wayne Alvarez, Wayne Alvarez  MR#:         [id number] BIRTHDATE: 06-13-1950 , 70  yrs. old GENDER: Male ENDOSCOPIST: R.  Garfield Cornea, MD FACP [facility] REFERRED BY:     Yevette Edwards, FNP PROCEDURE DATE:  04/20/2013 PROCEDURE:     Colonoscopy with biopsy  INDICATIONS:  Hemoccult-positive stool; normal hemoglobin. No GI symptoms otherwise  INFORMED CONSENT:  The risks, benefits, alternatives and imponderables including but not limited to bleeding, perforation as well as the possibility of a missed lesion have been reviewed.  The potential for biopsy, lesion removal, etc. have also been discussed.  Questions have been answered.  All parties agreeable. Please see the history and physical in the medical record for more information.  MEDICATIONS: Versed 3 mg IV and Demerol 50 mg IV in divided doses. Phenergan 25 mg IV and Zofran 4 mg IV  DESCRIPTION OF PROCEDURE:  After a digital rectal exam was performed, the EC-[id number] ([id number])  colonoscope was advanced from the anus through the rectum and colon to the area of the cecum, ileocecal valve and appendiceal orifice.  The cecum was deeply intubated.  These structures were well-seen and photographed for the record.  From the level of the cecum and ileocecal valve, the scope was slowly and cautiously withdrawn.  The mucosal surfaces were carefully surveyed utilizing scope tip deflection to facilitate fold flattening as needed.  The scope was pulled down into the rectum where a thorough examination including retroflexion was performed.    FINDINGS:  Adequate preparation. Normal rectum. (1) diminutive sigmoid polyp and (2) 3 mm cecal polyps. Otherwise, aside from a redundant colon, the colonic mucosa  appeared normal  THERAPEUTIC / DIAGNOSTIC MANEUVERS PERFORMED:  The above-mentioned polyps were cold  biopsied/removed.  COMPLICATIONS: None  CECAL WITHDRAWAL TIME:  16 minutes  IMPRESSION:  Colonic polyps-removed as described above. Redundant colon  RECOMMENDATIONS: Followup on pathology   _______________________________ eSigned:  R. Garfield Cornea, MD FACP [facility] 04/20/2013 1:49 PM Revised: 04/20/2013 1:49 PM  CC:

## 2013-04-23 ENCOUNTER — Encounter: Payer: Self-pay | Admitting: Internal Medicine

## 2013-04-23 ENCOUNTER — Encounter (HOSPITAL_COMMUNITY): Payer: Self-pay | Admitting: Internal Medicine

## 2013-05-18 ENCOUNTER — Ambulatory Visit (HOSPITAL_COMMUNITY)
Admission: RE | Admit: 2013-05-18 | Discharge: 2013-05-18 | Disposition: A | Discharge status: Home / Self Care | Payer: BC Managed Care – PPO | Source: Ambulatory Visit | Attending: Family Medicine | Admitting: Family Medicine

## 2013-05-18 ENCOUNTER — Other Ambulatory Visit: Payer: Self-pay

## 2013-05-21 ENCOUNTER — Encounter (HOSPITAL_COMMUNITY): Payer: Self-pay

## 2013-05-21 ENCOUNTER — Ambulatory Visit (HOSPITAL_COMMUNITY)
Admission: RE | Admit: 2013-05-21 | Discharge: 2013-05-21 | Disposition: A | Discharge status: Home / Self Care | Payer: BC Managed Care – PPO | Source: Ambulatory Visit | Attending: Anesthesiology | Admitting: Anesthesiology

## 2013-05-21 ENCOUNTER — Encounter (HOSPITAL_COMMUNITY)
Admission: RE | Admit: 2013-05-21 | Discharge: 2013-05-21 | Disposition: A | Discharge status: Home / Self Care | Payer: BC Managed Care – PPO | Source: Ambulatory Visit | Attending: Orthopedic Surgery | Admitting: Orthopedic Surgery

## 2013-05-21 DIAGNOSIS — M5030 Other cervical disc degeneration, unspecified cervical region: Secondary | ICD-10-CM | POA: Insufficient documentation

## 2013-05-21 DIAGNOSIS — I6529 Occlusion and stenosis of unspecified carotid artery: Secondary | ICD-10-CM | POA: Insufficient documentation

## 2013-05-21 DIAGNOSIS — G43909 Migraine, unspecified, not intractable, without status migrainosus: Secondary | ICD-10-CM | POA: Insufficient documentation

## 2013-05-21 DIAGNOSIS — Z87891 Personal history of nicotine dependence: Secondary | ICD-10-CM | POA: Insufficient documentation

## 2013-05-21 DIAGNOSIS — I10 Essential (primary) hypertension: Secondary | ICD-10-CM | POA: Insufficient documentation

## 2013-05-21 DIAGNOSIS — Z01812 Encounter for preprocedural laboratory examination: Secondary | ICD-10-CM | POA: Insufficient documentation

## 2013-05-21 DIAGNOSIS — I658 Occlusion and stenosis of other precerebral arteries: Secondary | ICD-10-CM | POA: Insufficient documentation

## 2013-05-21 DIAGNOSIS — M4712 Other spondylosis with myelopathy, cervical region: Secondary | ICD-10-CM | POA: Insufficient documentation

## 2013-05-21 HISTORY — DX: Personal history of colon polyps, unspecified: Z86.0100

## 2013-05-21 HISTORY — DX: Personal history of other diseases of the nervous system and sense organs: Z86.69

## 2013-05-21 HISTORY — DX: Other muscle spasm: M62.838

## 2013-05-21 HISTORY — DX: Personal history of colonic polyps: Z86.010

## 2013-05-21 HISTORY — DX: Weakness: R53.1

## 2013-05-21 LAB — CBC
HCT: 44.9 % (ref 39.0–52.0)
HEMOGLOBIN: 16.1 g/dL (ref 13.0–17.0)
MCH: 33.3 pg (ref 26.0–34.0)
MCHC: 35.9 g/dL (ref 30.0–36.0)
MCV: 92.8 fL (ref 78.0–100.0)
Platelets: 212 10*3/uL (ref 150–400)
RBC: 4.84 MIL/uL (ref 4.22–5.81)
RDW: 13.8 % (ref 11.5–15.5)
WBC: 10.3 10*3/uL (ref 4.0–10.5)

## 2013-05-21 LAB — BASIC METABOLIC PANEL
BUN: 14 mg/dL (ref 6–23)
CO2: 26 mEq/L (ref 19–32)
Calcium: 9.4 mg/dL (ref 8.4–10.5)
Chloride: 103 mEq/L (ref 96–112)
Creatinine, Ser: 1.09 mg/dL (ref 0.50–1.35)
GFR calc Af Amer: 82 mL/min — ABNORMAL LOW (ref >90)
GFR, EST NON AFRICAN AMERICAN: 71 mL/min — AB (ref >90)
GLUCOSE: 114 mg/dL — AB (ref 70–99)
POTASSIUM: 4.5 meq/L (ref 3.7–5.3)
Sodium: 142 mEq/L (ref 137–147)

## 2013-05-21 LAB — SURGICAL PCR SCREEN
MRSA, PCR: NEGATIVE
Staphylococcus aureus: NEGATIVE

## 2013-05-21 NOTE — Progress Notes (Addendum)
Pt doesn't have a cardiologist  Denies ever having an echo/stress test/heart cath   EKG in epic from 05-18-13  Sees NP Yevette Edwards with [facility]  Denies CXR in past yr

## 2013-05-21 NOTE — Pre-Procedure Instructions (Signed)
JAZIER MCGLAMERY  05/21/2013   Your procedure is scheduled on:  Fri, Feb 13 @ 1:00 PM  Report to [facility] Entrance A  at 11:00 AM.  Call this number if you have problems the morning of surgery: [phone]   Remember:   Do not eat food or drink liquids after midnight.   Take these medicines the morning of surgery with A SIP OF WATER: Amlodipine(Norvasc) and Pain Pill(if needed)              Stop taking your Aspirin and Aleve. No Goody's,BC's,Ibuprofen,Fish Oil,or any Herbal Medications   Do not wear jewelry  Do not wear lotions, powders, or colognes. You may wear deodorant.  Men may shave face and neck.  Do not bring valuables to the hospital.  [facility] is not responsible                  for any belongings or valuables.               Contacts, dentures or bridgework may not be worn into surgery.  Leave suitcase in the car. After surgery it may be brought to your room.  For patients admitted to the hospital, discharge time is determined by your                treatment team.               Patients discharged the day of surgery will not be allowed to drive  home.    Special Instructions:  [place] - Preparing for Surgery  Before surgery, you can play an important role.  Because skin is not sterile, your skin needs to be as free of germs as possible.  You can reduce the number of germs on you skin by washing with CHG (chlorahexidine gluconate) soap before surgery.  CHG is an antiseptic cleaner which kills germs and bonds with the skin to continue killing germs even after washing.  Please DO NOT use if you have an allergy to CHG or antibacterial soaps.  If your skin becomes reddened/irritated stop using the CHG and inform your nurse when you arrive at Short Stay.  Do not shave (including legs and underarms) for at least 48 hours prior to the first CHG shower.  You may shave your face.  Please follow these instructions carefully:   1.  Shower with CHG Soap the night  before surgery and the                                morning of Surgery.  2.  If you choose to wash your hair, wash your hair first as usual with your       normal shampoo.  3.  After you shampoo, rinse your hair and body thoroughly to remove the                      Shampoo.  4.  Use CHG as you would any other liquid soap.  You can apply chg directly       to the skin and wash gently with scrungie or a clean washcloth.  5.  Apply the CHG Soap to your body ONLY FROM THE NECK DOWN.        Do not use on open wounds or open sores.  Avoid contact with your eyes,       ears, mouth and  genitals (private parts).  Wash genitals (private parts)       with your normal soap.  6.  Wash thoroughly, paying special attention to the area where your surgery        will be performed.  7.  Thoroughly rinse your body with warm water from the neck down.  8.  DO NOT shower/wash with your normal soap after using and rinsing off       the CHG Soap.  9.  Pat yourself dry with a clean towel.            10.  Wear clean pajamas.            11.  Place clean sheets on your bed the night of your first shower and do not        sleep with pets.  Day of Surgery  Do not apply any lotions/deoderants the morning of surgery.  Please wear clean clothes to the hospital/surgery center.     Please read over the following fact sheets that you were given: Pain Booklet, Coughing and Deep Breathing, MRSA Information and Surgical Site Infection Prevention

## 2013-05-22 ENCOUNTER — Ambulatory Visit: Payer: BC Managed Care – PPO | Admitting: Cardiology

## 2013-05-22 DIAGNOSIS — E781 Pure hyperglyceridemia: Secondary | ICD-10-CM

## 2013-05-22 DIAGNOSIS — I10 Essential (primary) hypertension: Secondary | ICD-10-CM | POA: Insufficient documentation
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
## 2013-05-22 NOTE — Progress Notes (Addendum)
Anesthesia Chart Review:  Patient is a 63 year old male scheduled for C3-4 ACDF on 05/29/13 by Dr. Rolena Infante.  History includes smoking, HTN, migraines, tonsillectomy.  PCP was reported as Yevette Edwards, NP with [facility].    Appointments in [place] indicate that he is to be seen at [facility] on 05/25/13 for cardiac clearance.   EKG on 05/18/13 showed NSR, occasional PVCs, incomplete right BBB.  Preoperative CXR and labs noted.    I'll follow-up cardiology preoperative evaluation notes once available.  George Hugh [facility] Short Stay Center/Anesthesiology Phone [phone] 05/22/2013 3:44 PM  Addendum: 05/27/2013 1:35 PM Notes reviewed from Dr. Harl Bowie.  Echo done on 05/25/13 that showed upper normal LV wall thickness with LVEF 54-65%, grade 1 diastolic dysfunction, mildly thickened mitral leaflets, mild RA enlargement, unable to assess PASP.  Dr. Harl Bowie ultimately cleared patient for surgery.
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
## 2013-05-25 ENCOUNTER — Ambulatory Visit (HOSPITAL_COMMUNITY)
Admission: RE | Admit: 2013-05-25 | Discharge: 2013-05-25 | Disposition: A | Discharge status: Home / Self Care | Payer: BC Managed Care – PPO | Source: Ambulatory Visit | Attending: Cardiology | Admitting: Cardiology

## 2013-05-25 ENCOUNTER — Encounter (INDEPENDENT_AMBULATORY_CARE_PROVIDER_SITE_OTHER): Payer: Self-pay

## 2013-05-25 ENCOUNTER — Encounter: Payer: Self-pay | Admitting: Cardiology

## 2013-05-25 ENCOUNTER — Ambulatory Visit (INDEPENDENT_AMBULATORY_CARE_PROVIDER_SITE_OTHER): Payer: BC Managed Care – PPO | Admitting: Cardiology

## 2013-05-25 VITALS — BP 148/84 | HR 94 | Ht 71.0 in | Wt 172.0 lb

## 2013-05-25 DIAGNOSIS — E785 Hyperlipidemia, unspecified: Secondary | ICD-10-CM | POA: Insufficient documentation

## 2013-05-25 DIAGNOSIS — R9431 Abnormal electrocardiogram [ECG] [EKG]: Secondary | ICD-10-CM | POA: Insufficient documentation

## 2013-05-25 DIAGNOSIS — Z01810 Encounter for preprocedural cardiovascular examination: Secondary | ICD-10-CM

## 2013-05-25 DIAGNOSIS — I10 Essential (primary) hypertension: Secondary | ICD-10-CM | POA: Insufficient documentation

## 2013-05-25 DIAGNOSIS — F17200 Nicotine dependence, unspecified, uncomplicated: Secondary | ICD-10-CM | POA: Insufficient documentation

## 2013-05-25 DIAGNOSIS — I517 Cardiomegaly: Secondary | ICD-10-CM

## 2013-05-25 NOTE — Patient Instructions (Signed)
Your physician has requested that you have an echocardiogram. Echocardiography is a painless test that uses sound waves to create images of your heart. It provides your doctor with information about the size and shape of your heart and how well your heart's chambers and valves are working. This procedure takes approximately one hour. There are no restrictions for this procedure.  Your physician recommends that you schedule a follow-up appointment in: When needed

## 2013-05-25 NOTE — Progress Notes (Signed)
*  PRELIMINARY RESULTS* Echocardiogram 2D Echocardiogram has been performed.  [place], [place] 05/25/2013, 2:35 PM

## 2013-05-28 MED ORDER — ACETAMINOPHEN 10 MG/ML IV SOLN
1000.0000 mg | Freq: Four times a day (QID) | INTRAVENOUS | Status: DC
  Filled 2013-05-28: qty 100

## 2013-05-28 MED ORDER — DEXAMETHASONE SODIUM PHOSPHATE 4 MG/ML IJ SOLN: 4.0000 mg | Freq: Once | INTRAMUSCULAR | Status: DC

## 2013-05-28 NOTE — Progress Notes (Signed)
Notified pt. Of time change. Instructed him to arrive at 10:00 tomorrow.

## 2013-05-29 ENCOUNTER — Encounter (HOSPITAL_COMMUNITY): Payer: BC Managed Care – PPO | Admitting: Vascular Surgery

## 2013-05-29 ENCOUNTER — Ambulatory Visit (HOSPITAL_COMMUNITY): Payer: BC Managed Care – PPO | Admitting: Anesthesiology

## 2013-05-29 ENCOUNTER — Observation Stay (HOSPITAL_COMMUNITY)
Admission: RE | Admit: 2013-05-29 | Discharge: 2013-05-30 | Disposition: A | Discharge status: Home / Self Care | Payer: BC Managed Care – PPO | Source: Ambulatory Visit | Attending: Orthopedic Surgery | Admitting: Orthopedic Surgery

## 2013-05-29 ENCOUNTER — Observation Stay (HOSPITAL_COMMUNITY): Payer: BC Managed Care – PPO

## 2013-05-29 ENCOUNTER — Encounter (HOSPITAL_COMMUNITY): Payer: Self-pay | Admitting: *Deleted

## 2013-05-29 ENCOUNTER — Encounter (HOSPITAL_COMMUNITY)
Admission: RE | Disposition: A | Discharge status: Home / Self Care | Payer: Self-pay | Source: Ambulatory Visit | Attending: Orthopedic Surgery

## 2013-05-29 DIAGNOSIS — G8929 Other chronic pain: Secondary | ICD-10-CM | POA: Insufficient documentation

## 2013-05-29 DIAGNOSIS — Z860100 Personal history of colon polyps, unspecified: Secondary | ICD-10-CM | POA: Insufficient documentation

## 2013-05-29 DIAGNOSIS — I10 Essential (primary) hypertension: Secondary | ICD-10-CM | POA: Insufficient documentation

## 2013-05-29 DIAGNOSIS — M48061 Spinal stenosis, lumbar region without neurogenic claudication: Secondary | ICD-10-CM | POA: Diagnosis not present

## 2013-05-29 DIAGNOSIS — M542 Cervicalgia: Secondary | ICD-10-CM | POA: Diagnosis present

## 2013-05-29 DIAGNOSIS — M4712 Other spondylosis with myelopathy, cervical region: Secondary | ICD-10-CM | POA: Diagnosis not present

## 2013-05-29 DIAGNOSIS — M549 Dorsalgia, unspecified: Secondary | ICD-10-CM | POA: Insufficient documentation

## 2013-05-29 HISTORY — PX: ANTERIOR CERVICAL DECOMP/DISCECTOMY FUSION: SHX1161

## 2013-05-29 SURGERY — ANTERIOR CERVICAL DECOMPRESSION/DISCECTOMY FUSION 1 LEVEL
Anesthesia: General | Site: Spine Cervical

## 2013-05-29 MED ORDER — PROPOFOL INFUSION 10 MG/ML OPTIME
INTRAVENOUS | Status: DC | PRN
  Administered 2013-05-29: 120 ug/kg/min via INTRAVENOUS

## 2013-05-29 MED ORDER — POLYETHYLENE GLYCOL 3350 17 G PO PACK: 17.0000 g | PACK | Freq: Every day | ORAL | Status: DC

## 2013-05-29 MED ORDER — LIDOCAINE HCL (CARDIAC) 20 MG/ML IV SOLN
INTRAVENOUS | Status: AC
  Filled 2013-05-29: qty 5

## 2013-05-29 MED ORDER — DEXAMETHASONE SODIUM PHOSPHATE 4 MG/ML IJ SOLN
8.0000 mg | Freq: Four times a day (QID) | INTRAMUSCULAR | Status: DC
  Administered 2013-05-29 – 2013-05-30 (×3): 8 mg via INTRAVENOUS
  Filled 2013-05-29 (×7): qty 2

## 2013-05-29 MED ORDER — MENTHOL 3 MG MT LOZG: 1.0000 | LOZENGE | OROMUCOSAL | Status: DC | PRN

## 2013-05-29 MED ORDER — DEXAMETHASONE 4 MG PO TABS
4.0000 mg | ORAL_TABLET | Freq: Four times a day (QID) | ORAL | Status: DC
  Administered 2013-05-30: 4 mg via ORAL
  Filled 2013-05-29 (×7): qty 1

## 2013-05-29 MED ORDER — DOCUSATE SODIUM 100 MG PO CAPS: 100.0000 mg | ORAL_CAPSULE | Freq: Two times a day (BID) | ORAL | Status: DC

## 2013-05-29 MED ORDER — PHENOL 1.4 % MT LIQD
1.0000 | OROMUCOSAL | Status: DC | PRN
  Administered 2013-05-30: 1 via OROMUCOSAL
  Filled 2013-05-29: qty 177

## 2013-05-29 MED ORDER — OXYCODONE-ACETAMINOPHEN 5-325 MG PO TABS: 1.0000 | ORAL_TABLET | Freq: Four times a day (QID) | ORAL | Status: DC | PRN

## 2013-05-29 MED ORDER — MIDAZOLAM HCL 5 MG/5ML IJ SOLN
INTRAMUSCULAR | Status: DC | PRN
  Administered 2013-05-29: 2 mg via INTRAVENOUS

## 2013-05-29 MED ORDER — ONDANSETRON 4 MG PO TBDP: 4.0000 mg | ORAL_TABLET | Freq: Three times a day (TID) | ORAL | Status: DC | PRN

## 2013-05-29 MED ORDER — CEFAZOLIN SODIUM 1-5 GM-% IV SOLN
1.0000 g | Freq: Three times a day (TID) | INTRAVENOUS | Status: AC
  Administered 2013-05-29 – 2013-05-30 (×2): 1 g via INTRAVENOUS
  Filled 2013-05-29 (×2): qty 50

## 2013-05-29 MED ORDER — ACETAMINOPHEN 10 MG/ML IV SOLN
INTRAVENOUS | Status: AC
  Filled 2013-05-29: qty 100

## 2013-05-29 MED ORDER — SODIUM CHLORIDE 0.9 % IJ SOLN: 3.0000 mL | INTRAMUSCULAR | Status: DC | PRN

## 2013-05-29 MED ORDER — BUPIVACAINE-EPINEPHRINE (PF) 0.25% -1:200000 IJ SOLN
INTRAMUSCULAR | Status: AC
  Filled 2013-05-29: qty 30

## 2013-05-29 MED ORDER — LIDOCAINE HCL (CARDIAC) 20 MG/ML IV SOLN
INTRAVENOUS | Status: DC | PRN
  Administered 2013-05-29: 50 mg via INTRAVENOUS

## 2013-05-29 MED ORDER — AMLODIPINE BESYLATE 2.5 MG PO TABS
2.5000 mg | ORAL_TABLET | Freq: Every day | ORAL | Status: DC
  Administered 2013-05-30: 2.5 mg via ORAL
  Filled 2013-05-29: qty 1

## 2013-05-29 MED ORDER — THROMBIN 20000 UNITS EX SOLR
CUTANEOUS | Status: AC
  Filled 2013-05-29: qty 20000

## 2013-05-29 MED ORDER — LACTATED RINGERS IV SOLN
INTRAVENOUS | Status: DC
Start: 2013-05-29 — End: 2013-05-30
  Administered 2013-05-29 (×2): via INTRAVENOUS

## 2013-05-29 MED ORDER — THROMBIN 20000 UNITS EX SOLR
CUTANEOUS | Status: DC | PRN
  Administered 2013-05-29: 13:00:00 via TOPICAL

## 2013-05-29 MED ORDER — 0.9 % SODIUM CHLORIDE (POUR BTL) OPTIME
TOPICAL | Status: DC | PRN
  Administered 2013-05-29: 1000 mL

## 2013-05-29 MED ORDER — PHENYLEPHRINE 40 MCG/ML (10ML) SYRINGE FOR IV PUSH (FOR BLOOD PRESSURE SUPPORT)
PREFILLED_SYRINGE | INTRAVENOUS | Status: AC
  Filled 2013-05-29: qty 10

## 2013-05-29 MED ORDER — FENTANYL CITRATE 0.05 MG/ML IJ SOLN
INTRAMUSCULAR | Status: DC | PRN
  Administered 2013-05-29: 100 ug via INTRAVENOUS
  Administered 2013-05-29: 150 ug via INTRAVENOUS

## 2013-05-29 MED ORDER — SODIUM CHLORIDE 0.9 % IJ SOLN: 3.0000 mL | Freq: Two times a day (BID) | INTRAMUSCULAR | Status: DC

## 2013-05-29 MED ORDER — ACETAMINOPHEN 10 MG/ML IV SOLN
INTRAVENOUS | Status: DC | PRN
  Administered 2013-05-29: 1000 mg via INTRAVENOUS

## 2013-05-29 MED ORDER — ACETAMINOPHEN 10 MG/ML IV SOLN
1000.0000 mg | Freq: Four times a day (QID) | INTRAVENOUS | Status: DC
  Administered 2013-05-29 – 2013-05-30 (×3): 1000 mg via INTRAVENOUS
  Filled 2013-05-29 (×4): qty 100

## 2013-05-29 MED ORDER — PROPOFOL 10 MG/ML IV BOLUS
INTRAVENOUS | Status: AC
  Filled 2013-05-29: qty 20

## 2013-05-29 MED ORDER — METHOCARBAMOL 500 MG PO TABS: 500.0000 mg | ORAL_TABLET | Freq: Four times a day (QID) | ORAL | Status: DC | PRN

## 2013-05-29 MED ORDER — SODIUM CHLORIDE 0.9 % IV SOLN: 250.0000 mL | INTRAVENOUS | Status: DC

## 2013-05-29 MED ORDER — ARTIFICIAL TEARS OP OINT
TOPICAL_OINTMENT | OPHTHALMIC | Status: AC
  Filled 2013-05-29: qty 3.5

## 2013-05-29 MED ORDER — CEFAZOLIN SODIUM-DEXTROSE 2-3 GM-% IV SOLR
2.0000 g | Freq: Once | INTRAVENOUS | Status: AC
  Administered 2013-05-29: 2 g via INTRAVENOUS

## 2013-05-29 MED ORDER — PROPOFOL 10 MG/ML IV BOLUS
INTRAVENOUS | Status: DC | PRN
  Administered 2013-05-29: 130 mg via INTRAVENOUS

## 2013-05-29 MED ORDER — METHOCARBAMOL 500 MG PO TABS
500.0000 mg | ORAL_TABLET | Freq: Four times a day (QID) | ORAL | Status: DC | PRN
  Administered 2013-05-29: 500 mg via ORAL
  Filled 2013-05-29: qty 1

## 2013-05-29 MED ORDER — LACTATED RINGERS IV SOLN
INTRAVENOUS | Status: DC
  Administered 2013-05-30: 07:00:00 via INTRAVENOUS

## 2013-05-29 MED ORDER — ONDANSETRON HCL 4 MG/2ML IJ SOLN: 4.0000 mg | INTRAMUSCULAR | Status: DC | PRN

## 2013-05-29 MED ORDER — SUCCINYLCHOLINE CHLORIDE 20 MG/ML IJ SOLN
INTRAMUSCULAR | Status: DC | PRN
  Administered 2013-05-29: 100 mg via INTRAVENOUS

## 2013-05-29 MED ORDER — OXYCODONE HCL 5 MG PO TABS: 5.0000 mg | ORAL_TABLET | Freq: Once | ORAL | Status: DC | PRN

## 2013-05-29 MED ORDER — HYDROMORPHONE HCL PF 1 MG/ML IJ SOLN
0.2500 mg | INTRAMUSCULAR | Status: DC | PRN
  Administered 2013-05-29: 0.5 mg via INTRAVENOUS
  Administered 2013-05-29: 0.25 mg via INTRAVENOUS
  Administered 2013-05-29: 0.5 mg via INTRAVENOUS
  Administered 2013-05-29: 0.25 mg via INTRAVENOUS
  Administered 2013-05-29: 0.5 mg via INTRAVENOUS

## 2013-05-29 MED ORDER — HYDROMORPHONE HCL PF 1 MG/ML IJ SOLN
INTRAMUSCULAR | Status: AC
  Filled 2013-05-29: qty 1

## 2013-05-29 MED ORDER — OXYCODONE HCL 5 MG/5ML PO SOLN: 5.0000 mg | Freq: Once | ORAL | Status: DC | PRN

## 2013-05-29 MED ORDER — METHOCARBAMOL 100 MG/ML IJ SOLN
500.0000 mg | Freq: Four times a day (QID) | INTRAVENOUS | Status: DC | PRN
  Filled 2013-05-29: qty 5

## 2013-05-29 MED ORDER — MORPHINE SULFATE 2 MG/ML IJ SOLN
1.0000 mg | INTRAMUSCULAR | Status: DC | PRN
  Administered 2013-05-30: 2 mg via INTRAVENOUS
  Filled 2013-05-29: qty 1

## 2013-05-29 MED ORDER — DEXAMETHASONE SODIUM PHOSPHATE 4 MG/ML IJ SOLN
INTRAMUSCULAR | Status: AC
  Administered 2013-05-29: 4 mg via INTRAVENOUS
  Filled 2013-05-29: qty 1

## 2013-05-29 MED ORDER — OXYCODONE HCL 5 MG PO TABS
10.0000 mg | ORAL_TABLET | ORAL | Status: DC | PRN
  Administered 2013-05-29: 10 mg via ORAL
  Filled 2013-05-29: qty 2

## 2013-05-29 MED ORDER — BUPIVACAINE-EPINEPHRINE 0.25% -1:200000 IJ SOLN
INTRAMUSCULAR | Status: DC | PRN
  Administered 2013-05-29: 6 mL

## 2013-05-29 SURGICAL SUPPLY — 62 items
BLADE SURG ROTATE 9660 (MISCELLANEOUS) IMPLANT
BUR EGG ELITE 4.0 (BURR) IMPLANT
BUR EGG ELITE 4.0MM (BURR)
BUR MATCHSTICK NEURO 3.0 LAGG (BURR) IMPLANT
CANISTER SUCTION 2500CC (MISCELLANEOUS) ×3 IMPLANT
CLOSURE STERI-STRIP 1/2X4 (GAUZE/BANDAGES/DRESSINGS) ×1
CLOTH BEACON ORANGE TIMEOUT ST (SAFETY) ×3 IMPLANT
CLSR STERI-STRIP ANTIMIC 1/2X4 (GAUZE/BANDAGES/DRESSINGS) ×2 IMPLANT
CORDS BIPOLAR (ELECTRODE) ×3 IMPLANT
COVER SURGICAL LIGHT HANDLE (MISCELLANEOUS) ×6 IMPLANT
CRADLE DONUT ADULT HEAD (MISCELLANEOUS) ×3 IMPLANT
DRAPE C-ARM 42X72 X-RAY (DRAPES) ×3 IMPLANT
DRAPE POUCH INSTRU U-SHP 10X18 (DRAPES) ×3 IMPLANT
DRAPE SURG 17X23 STRL (DRAPES) ×3 IMPLANT
DRAPE U-SHAPE 47X51 STRL (DRAPES) ×3 IMPLANT
DRSG MEPILEX BORDER 4X4 (GAUZE/BANDAGES/DRESSINGS) ×3 IMPLANT
DURAPREP 26ML APPLICATOR (WOUND CARE) ×3 IMPLANT
ELECT COATED BLADE 2.86 ST (ELECTRODE) ×3 IMPLANT
ELECT REM PT RETURN 9FT ADLT (ELECTROSURGICAL) ×3
ELECTRODE REM PT RTRN 9FT ADLT (ELECTROSURGICAL) ×1 IMPLANT
GLOVE BIOGEL PI IND STRL 8 (GLOVE) ×1 IMPLANT
GLOVE BIOGEL PI IND STRL 8.5 (GLOVE) ×1 IMPLANT
GLOVE BIOGEL PI INDICATOR 8 (GLOVE) ×2
GLOVE BIOGEL PI INDICATOR 8.5 (GLOVE) ×2
GLOVE ECLIPSE 8.5 STRL (GLOVE) ×3 IMPLANT
GLOVE ORTHO TXT STRL SZ7.5 (GLOVE) ×3 IMPLANT
GOWN STRL REIN 2XL XLG LVL4 (GOWN DISPOSABLE) ×3 IMPLANT
GOWN STRL REIN XL XLG (GOWN DISPOSABLE) ×3 IMPLANT
GOWN STRL REUS W/TWL 2XL LVL3 (GOWN DISPOSABLE) ×3 IMPLANT
IMPLANT PEEK LORDOTIC LRG 8MM (Peek) ×2 IMPLANT
KIT BASIN OR (CUSTOM PROCEDURE TRAY) ×3 IMPLANT
KIT ROOM TURNOVER OR (KITS) ×3 IMPLANT
NDL SPNL 18GX3.5 QUINCKE PK (NEEDLE) ×1 IMPLANT
NEEDLE SPNL 18GX3.5 QUINCKE PK (NEEDLE) ×3 IMPLANT
NS IRRIG 1000ML POUR BTL (IV SOLUTION) ×3 IMPLANT
PACK ORTHO CERVICAL (CUSTOM PROCEDURE TRAY) ×3 IMPLANT
PACK UNIVERSAL I (CUSTOM PROCEDURE TRAY) ×3 IMPLANT
PAD ARMBOARD 7.5X6 YLW CONV (MISCELLANEOUS) ×6 IMPLANT
PATTIES SURGICAL .25X.25 (GAUZE/BANDAGES/DRESSINGS) IMPLANT
PIN DISTRACTION 14 (PIN) ×2 IMPLANT
PIN RETAINER PRODISC 14 MM (PIN) ×2 IMPLANT
PUTTY BONE DBX 2.5 MIS (Bone Implant) ×2 IMPLANT
RESTRAINT LIMB HOLDER UNIV (RESTRAINTS) ×3 IMPLANT
SCREW SELF DRILLING 14MM (Screw) ×4 IMPLANT
SCREW SELF DRILLING 16MM (Screw) ×2 IMPLANT
SPONGE INTESTINAL PEANUT (DISPOSABLE) ×3 IMPLANT
SPONGE SURGIFOAM ABS GEL 100 (HEMOSTASIS) ×3 IMPLANT
SURGIFLO TRUKIT (HEMOSTASIS) IMPLANT
SUT BONE WAX W31G (SUTURE) ×2 IMPLANT
SUT MON AB 3-0 SH 27 (SUTURE) ×3
SUT MON AB 3-0 SH27 (SUTURE) ×1 IMPLANT
SUT SILK 2 0 (SUTURE)
SUT SILK 2-0 18XBRD TIE 12 (SUTURE) IMPLANT
SUT VIC AB 2-0 CT1 18 (SUTURE) ×3 IMPLANT
SUT VICRYL AB 2 0 TIES (SUTURE) ×2 IMPLANT
SYR BULB IRRIGATION 50ML (SYRINGE) ×3 IMPLANT
SYR CONTROL 10ML LL (SYRINGE) ×3 IMPLANT
TAPE CLOTH 4X10 WHT NS (GAUZE/BANDAGES/DRESSINGS) ×3 IMPLANT
TAPE UMBILICAL COTTON 1/8X30 (MISCELLANEOUS) ×3 IMPLANT
TOWEL OR 17X24 6PK STRL BLUE (TOWEL DISPOSABLE) ×3 IMPLANT
TOWEL OR 17X26 10 PK STRL BLUE (TOWEL DISPOSABLE) ×3 IMPLANT
WATER STERILE IRR 1000ML POUR (IV SOLUTION) ×3 IMPLANT

## 2013-05-29 NOTE — Discharge Instructions (Signed)

## 2013-05-29 NOTE — Anesthesia Preprocedure Evaluation (Signed)
Anesthesia Evaluation  Patient identified by MRN, date of birth, ID band Patient awake    Reviewed: Allergy & Precautions, H&P , NPO status , Patient's Chart, lab work & pertinent test results  Airway Mallampati: II TM Distance: >3 FB Neck ROM: Full    Dental no notable dental hx. (+) Upper Dentures, Dental Advisory Given   Pulmonary Current Smoker,  breath sounds clear to auscultation  Pulmonary exam normal       Cardiovascular hypertension, On Medications Rhythm:Regular Rate:Normal     Neuro/Psych negative neurological ROS  negative psych ROS   GI/Hepatic negative GI ROS, Neg liver ROS,   Endo/Other  negative endocrine ROS  Renal/GU negative Renal ROS  negative genitourinary   Musculoskeletal   Abdominal   Peds  Hematology negative hematology ROS (+)   Anesthesia Other Findings   Reproductive/Obstetrics negative OB ROS                           Anesthesia Physical Anesthesia Plan  ASA: II  Anesthesia Plan: General   Post-op Pain Management:    Induction: Intravenous  Airway Management Planned: Oral ETT  Additional Equipment:   Intra-op Plan:   Post-operative Plan: Extubation in OR  Informed Consent: I have reviewed the patients History and Physical, chart, labs and discussed the procedure including the risks, benefits and alternatives for the proposed anesthesia with the patient or authorized representative who has indicated his/her understanding and acceptance.   Dental advisory given  Plan Discussed with: CRNA  Anesthesia Plan Comments:         Anesthesia Quick Evaluation

## 2013-05-29 NOTE — Transfer of Care (Signed)
Immediate Anesthesia Transfer of Care Note  Patient: Wayne Alvarez  Procedure(s) Performed: Procedure(s): ANTERIOR CERVICAL DECOMPRESSION/DISCECTOMY FUSION C3-4 (N/A)  Patient Location: PACU  Anesthesia Type:General  Level of Consciousness: awake, sedated and patient cooperative  Airway & Oxygen Therapy: Patient Spontanous Breathing and Patient connected to nasal cannula oxygen  Post-op Assessment: Report given to PACU RN, Post -op Vital signs reviewed and stable, Patient moving all extremities and Patient moving all extremities X 4  Post vital signs: Reviewed and stable  Complications: No apparent anesthesia complications

## 2013-05-29 NOTE — Preoperative (Signed)
Beta Blockers   Reason not to administer Beta Blockers:Not Applicable 

## 2013-05-29 NOTE — Anesthesia Postprocedure Evaluation (Signed)
  Anesthesia Post-op Note  Patient: Wayne Alvarez  Procedure(s) Performed: Procedure(s): ANTERIOR CERVICAL DECOMPRESSION/DISCECTOMY FUSION C3-4 (N/A)  Patient Location: PACU  Anesthesia Type:General  Level of Consciousness: awake, alert  and oriented  Airway and Oxygen Therapy: Patient Spontanous Breathing and Patient connected to nasal cannula oxygen  Post-op Pain: mild  Post-op Assessment: Post-op Vital signs reviewed, Patient's Cardiovascular Status Stable, Respiratory Function Stable, Patent Airway, No signs of Nausea or vomiting and Pain level controlled  Post-op Vital Signs: Reviewed and stable  Complications: No apparent anesthesia complications

## 2013-05-29 NOTE — Brief Op Note (Signed)
05/29/2013  3:09 PM  PATIENT:  Wayne Alvarez  63 y.o. male  PRE-OPERATIVE DIAGNOSIS:  CERVICAL SPONDYLOTIC MYELOPATHY  POST-OPERATIVE DIAGNOSIS:  CERVICAL SPONDYLOTIC MYELOPATHY  PROCEDURE:  Procedure(s): ANTERIOR CERVICAL DECOMPRESSION/DISCECTOMY FUSION C3-4 (N/A)  SURGEON:  Surgeon(s) and Role:    * Melina Schools, MD - Primary  PHYSICIAN ASSISTANT:   ASSISTANTS: none   ANESTHESIA:   general  EBL:  Total I/O In: 1300 [I.V.:1300] Out: -   BLOOD ADMINISTERED:none  DRAINS: none   LOCAL MEDICATIONS USED:  MARCAINE     SPECIMEN:  No Specimen  DISPOSITION OF SPECIMEN:  N/A  COUNTS:  YES  TOURNIQUET:  * No tourniquets in log *  DICTATION: .Other Dictation: Dictation Number [id number]  PLAN OF CARE: Admit to inpatient   PATIENT DISPOSITION:  PACU - hemodynamically stable.
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
## 2013-05-30 DIAGNOSIS — M4712 Other spondylosis with myelopathy, cervical region: Secondary | ICD-10-CM | POA: Diagnosis not present

## 2013-05-30 NOTE — Evaluation (Signed)
Physical Therapy Evaluation Patient Details Name: Wayne Alvarez MRN: [id number] DOB: [birth year] Today's Date: 05/30/2013 Time: 3329-5188 PT Time Calculation (min): 10 min  PT Assessment / Plan / Recommendation History of Present Illness  pt s/p ANTERIOR CERVICAL DECOMPRESSION/DISCECTOMY FUSION C3-4   Clinical Impression  Pt is independent with all mobility, gait and stairs, no further acute PT needs identified at this time.  Pt states he has received HEP from outpatient therapist prior to procedure.  Pt states he feels comfortable donning/doffing cervical collar.    PT Assessment  Patent does not need any further PT services    Follow Up Recommendations  No PT follow up    Does the patient have the potential to tolerate intense rehabilitation      Barriers to Discharge        Equipment Recommendations  None recommended by PT    Recommendations for Other Services     Frequency      Precautions / Restrictions Precautions Required Braces or Orthoses: Cervical Brace Cervical Brace: Hard collar Restrictions Weight Bearing Restrictions: No   Pertinent Vitals/Pain No c/o pain      Mobility  Bed Mobility Overal bed mobility: Independent Transfers Overall transfer level: Independent Ambulation/Gait Ambulation/Gait assistance: Independent Ambulation Distance (Feet): 200 Feet Assistive device: None General Gait Details: no LOB Stairs: Yes Stairs assistance: Independent Stair Management: One rail Right Number of Stairs: 10 General stair comments: no LOB    Exercises     PT Diagnosis:    PT Problem List:   PT Treatment Interventions:       PT Goals(Current goals can be found in the care plan section) Acute Rehab PT Goals PT Goal Formulation: No goals set, d/c therapy  Visit Information  Last PT Received On: 05/30/13 Assistance Needed: +1 History of Present Illness: pt s/p ANTERIOR CERVICAL DECOMPRESSION/DISCECTOMY FUSION C3-4        Prior Functioning  Home Living Family/patient expects to be discharged to:: Private residence Living Arrangements: Spouse/significant other Available Help at Discharge: Family Type of Home: House Home Layout: Two level Alternate Level Stairs-Number of Steps: flight Alternate Level Stairs-Rails: Right Home Equipment: None Prior Function Level of Independence: Independent Communication Communication: No difficulties    Cognition  Cognition Arousal/Alertness: Awake/alert Behavior During Therapy: WFL for tasks assessed/performed Overall Cognitive Status: Within Functional Limits for tasks assessed    Extremity/Trunk Assessment Upper Extremity Assessment Upper Extremity Assessment: Overall WFL for tasks assessed Lower Extremity Assessment Lower Extremity Assessment: Overall WFL for tasks assessed Cervical / Trunk Assessment Cervical / Trunk Assessment: Normal   Balance    End of Session PT - End of Session Equipment Utilized During Treatment: Cervical collar Activity Tolerance: Patient tolerated treatment well Patient left: in chair;with call bell/phone within reach Nurse Communication: Mobility status  GP Functional Assessment Tool Used: clinical judgement Functional Limitation: Mobility: Walking and moving around Mobility: Walking and Moving Around Current Status (C1660): At least 1 percent but less than 20 percent impaired, limited or restricted Mobility: Walking and Moving Around Goal Status [phone]): At least 1 percent but less than 20 percent impaired, limited or restricted Mobility: Walking and Moving Around Discharge Status [phone]): At least 1 percent but less than 20 percent impaired, limited or restricted   [facility] 05/30/2013, 10:53 AM

## 2013-05-30 NOTE — Op Note (Signed)
NAME:  LANNIE, YUSUF NO.:  [id number]  MEDICAL RECORD NO.:  [id number]  LOCATION:  [id number]                        FACILITY:  [facility]  PHYSICIAN:  Dahlia Bailiff, MD    DATE OF BIRTH:  November 25, 1950  DATE OF PROCEDURE:  05/29/2013 DATE OF DISCHARGE:                              OPERATIVE REPORT   PREOPERATIVE DIAGNOSIS:  Cervical spondylitic myelopathy, C3-4.  POSTOPERATIVE DIAGNOSIS:  Cervical spondylitic myelopathy, C3-4.  OPERATIVE PROCEDURES:  Anterior cervical diskectomy and fusion, C3-4. Intraoperative EMG, SSEP and evoked motor potentials remained normal throughout the case with no adverse responses.  HISTORY:  This is a very pleasant 63 year old gentleman who presented to my office with altered gait pattern, clumsiness in neck and left arm pain.  Imaging studies confirmed cord signal changes at C3-4.  The diagnosis of cervical spondylitic myelopathy was made.  He also had degenerative changes in bone spurs at C6-7, but no true C7 radiculopathy.  As a result of the myelopathic findings, we elected to proceed with surgery.  All appropriate risks, benefits, and alternatives to surgery were discussed, and consent was obtained.  OPERATIVE NOTE:  The patient was brought to the operating room and placed supine on the operating table.  After successful induction of general anesthesia and endotracheal intubation, TEDs and SCDs were applied.  The anterior cervical spine was prepped and draped in a standard fashion.  Time-out was taken confirming patient, procedure and all other pertinent important data.  Once this was completed, I identified the 3-4 level with fluoro and infiltrated the incision site with 0.25% Marcaine.  Left- sided transverse incision was made starting at the midline.  Sharp dissection was carried out down to the platysma.  The platysma was sharply incised and I am again dissecting along the lateral border of the sternocleidomastoid.  I  identified the carotid sheath and protected it with a finger.  I then swept the esophagus and trachea to the right and then bluntly dissected with Kittner dissectors through the remaining deep cervical and prevertebral fascia until I was down to the anterior longitudinal ligament.  I then placed a needle into the 3-4 disk space and took an intraoperative x-ray to confirm that I was at the 3-4 disk space.  Once this was completed, I then marked the disk space.  I then mobilized the longus coli muscles from the midbody of C3 to the midbody of C4 with bipolar electrocautery.  I then placed a self- retaining retractor blades into the wound underneath the longus colli muscle, deflated the endotracheal cuff, expanded the retractor and then reinflated the cuff.  At this point, the 3-4 disk was well-visualized.  I performed an annulotomy with a 15-blade scalpel and then used a pituitary rongeur to remove the bulk of the disk material.  Once this was out, I used a curette to scrape the subchondral bone from the cartilaginous endplate off.  This exposed the subchondral bone.  I then used a 2-mm Kerrison to remove the overhanging osteophyte from the inferior aspect of the C3 vertebral body.  I then placed distraction pins into the body of C3-4 and gently distracted the intervertebral space and maintained the distraction with the  retractors.  I then continued using a nerve hook and nerve curette to resect the disk.  I removed all the way down to the posterior annulus.  Once I was there, I then dissected through the posterior annulus with a curved nerve hook.  I then used a 1-mm Kerrison to remove the posterior annulus.  I then swept underneath the posterior longitudinal ligament with a fine nerve hook and then resected the posterior longitudinal ligament.  There was a large fragment of disk material most central consistent with what was seen on the MRI.  At this point, with the decompression  complete, I then rasped the endplates.  I trialed with the Synthes 0 profile PEEK interbody spacer.  I elected to use this as there was less likely chance of esophageal irritation and dysphasia, then if I have placed a plate.  I elected to use the large size 8 lordotic spacer.  This was packed with DBX Mix and malleted to the appropriate depth.  It was well fitting and positively positioned in the neck.  Once it was properly positioned, I then placed a 40-mm locking screw into the body of C3 and a 16-mm locking screw into the body of C4.  Both screws had excellent purchase and they were advanced to the appropriate depth with the locking nut captured the screw to prevent backout.  I then irrigated the wound copiously with normal saline, removed the distraction pins, plugged the distraction pinholes with bone wax and then made sure I had hemostasis using bipolar electrocautery.  I then returned the trachea and esophagus to midline, closed the platysma with interrupted 2-0 Vicryl sutures and a 3-0 Monocryl for the skin.  Steri-Strips and a dry dressing were applied.  At the end of the case, all needle and sponge counts were correct. There were no adverse intraoperative events.     Dahlia Bailiff, MD     DDB/MEDQ  D:  05/29/2013  T:  05/30/2013  Job:  [id number]

## 2013-05-30 NOTE — Progress Notes (Signed)
OT Cancellation Note and Discharge  Patient Details Name: Wayne Alvarez MRN: [id number] DOB: [birth year]   Cancelled Treatment:    Reason Eval/Treat Not Completed: OT screened, no needs identified, will sign off. Pt is doing wonderfully and reports that "[name]" went over the wear and care of his c-collar in the office with him (how to adjust it and change out the pads). Pt got himself dressed and reports no issues.   Almon Register [phone] 05/30/2013, 9:49 AM

## 2013-05-30 NOTE — Progress Notes (Signed)
Subjective: 1 Day Post-Op Procedure(s) (LRB): ANTERIOR CERVICAL DECOMPRESSION/DISCECTOMY FUSION C3-4 (N/A) Patient reports pain as mild.  Mild incisional pain. Arm pain resolved. Had one short episode of tingling left hand yesterday otherwise no N/T. Feels ready for D/C today.  Objective: Vital signs in last 24 hours: Temp:  [97.7 F (36.5 C)-98.6 F (37 C)] 98.3 F (36.8 C) (02/14 0418) Pulse Rate:  [68-91] 68 (02/14 0418) Resp:  [10-20] 16 (02/14 0418) BP: (108-169)/(69-95) 112/73 mmHg (02/14 0418) SpO2:  [95 %-100 %] 97 % (02/14 0418) Weight:  [78.019 kg (172 lb)] 78.019 kg (172 lb) (02/13 1643)  Intake/Output from previous day: 02/13 0701 - 02/14 0700 In: 6503 [P.O.:120; I.V.:1300] Out: -  Intake/Output this shift: Total I/O In: 120 [P.O.:120] Out: -   No results found for this basename: HGB,  in the last 72 hours No results found for this basename: WBC, RBC, HCT, PLT,  in the last 72 hours No results found for this basename: NA, K, CL, CO2, BUN, CREATININE, GLUCOSE, CALCIUM,  in the last 72 hours No results found for this basename: LABPT, INR,  in the last 72 hours  Neurologically intact ABD soft Neurovascular intact Sensation intact distally Intact pulses distally Dorsiflexion/Plantar flexion intact Incision: dressing C/D/I and no drainage No cellulitis present Compartment soft no calf pain  Assessment/Plan: 1 Day Post-Op Procedure(s) (LRB): ANTERIOR CERVICAL DECOMPRESSION/DISCECTOMY FUSION C3-4 (N/A) Advance diet Up with therapy D/C IV fluids Reviewed D/C instructions D/C home today Discussed with Dr. Ewing Schlein, JACLYN M. 05/30/2013, 8:42 AM

## 2013-05-30 NOTE — Progress Notes (Signed)
UR Completed.  Wayne Alvarez [id number] 05/30/2013
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
## 2013-06-01 NOTE — Discharge Summary (Signed)
Agree with above F/U 2 weeks for wound check

## 2013-06-01 NOTE — Care Management Note (Signed)
CARE MANAGEMENT NOTE 06/01/2013  Patient:  Wayne Alvarez, Wayne Alvarez   Account Number:  [id number]  Date Initiated:  06/01/2013  Documentation initiated by:  Ricki Miller  Subjective/Objective Assessment:   63 yr old male s/p C3-4 ACDF     Action/Plan:   PT/OT evaluated patient, no home health needs identified. No Case management needs identified.   Anticipated DC Date:  05/30/2013   Anticipated DC Plan:  [place]  CM consult      Choice offered to / List presented to:             Status of service:  Completed, signed off  Discharge Disposition:  [place]

## 2013-06-02 ENCOUNTER — Encounter (HOSPITAL_COMMUNITY): Payer: Self-pay | Admitting: Orthopedic Surgery

## 2013-10-12 ENCOUNTER — Ambulatory Visit (HOSPITAL_COMMUNITY): Payer: No Typology Code available for payment source | Admitting: Physical Therapy

## 2013-10-21 ENCOUNTER — Ambulatory Visit (HOSPITAL_COMMUNITY)
Admission: RE | Admit: 2013-10-21 | Discharge: 2013-10-21 | Disposition: A | Discharge status: Home / Self Care | Payer: Self-pay | Source: Ambulatory Visit | Attending: Orthopedic Surgery | Admitting: Orthopedic Surgery

## 2013-10-21 DIAGNOSIS — M25519 Pain in unspecified shoulder: Secondary | ICD-10-CM | POA: Insufficient documentation

## 2013-10-21 DIAGNOSIS — R209 Unspecified disturbances of skin sensation: Secondary | ICD-10-CM | POA: Insufficient documentation

## 2013-10-21 DIAGNOSIS — M542 Cervicalgia: Secondary | ICD-10-CM | POA: Insufficient documentation

## 2013-10-21 DIAGNOSIS — IMO0001 Reserved for inherently not codable concepts without codable children: Secondary | ICD-10-CM | POA: Insufficient documentation

## 2013-10-21 DIAGNOSIS — I10 Essential (primary) hypertension: Secondary | ICD-10-CM | POA: Insufficient documentation

## 2013-10-21 DIAGNOSIS — G8929 Other chronic pain: Secondary | ICD-10-CM | POA: Insufficient documentation
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
## 2013-10-28 ENCOUNTER — Ambulatory Visit (HOSPITAL_COMMUNITY)
Admission: RE | Admit: 2013-10-28 | Discharge: 2013-10-28 | Disposition: A | Discharge status: Home / Self Care | Payer: Self-pay | Source: Ambulatory Visit | Attending: Orthopedic Surgery | Admitting: Orthopedic Surgery

## 2013-10-28 NOTE — Progress Notes (Signed)
Physical Therapy Treatment Patient Details  Name: Wayne Alvarez MRN: [id number] Date of Birth: 09-Aug-1950  Today's Date: 10/28/2013 Time: 1520-1555 PT Time Calculation (min): 35 min  Visit#: 2 of 12  Re-eval: 11/20/13 Authorization: [employer] medicare  Authorization Visit#: 2 of 10  Charges:  therex 35  Subjective: Symptoms/Limitations Symptoms: Pt states he is overall better.   Reports complaince with HEP .  STates he's been swimming also. Pain Assessment Currently in Pain?: No/denies   Exercise/Treatments Stretches Upper Trapezius Stretch: 3 reps;10 seconds Levator Stretch: 3 reps;10 seconds Other Neck Stretches: 3D Thoracic spine excursions 10x Other Neck Stretches: 3way Pectoral stretch 10x 3 seconds Machines for Strengthening UBE (Upper Arm Bike): 4 minutes backward level 3 Theraband Exercises Scapula Retraction: 10 reps;Green Shoulder Extension: 10 reps;Green Rows: 10 reps;Green    Physical Therapy Assessment and Plan PT Assessment and Plan Clinical Impression Statement: Pt with improving neck mobility and no pain today.  Pt requires therapist assist to isolate lateral flexion due to shoulder involvement.   Added Upper trap and levator stretches, 3D thoracic excursions with UE's and postural theraband exercieses all with good form today.  PT given written HEP for stretches and thoracic excursions.  PT reported "feeling great" upon leaving today.   Clinical Impairments Affecting Rehab Potential: Patient is highy motivated PT Plan: Introduce and progress mobility exercises next session and strenghteing exercises to address the above listed limitations. Add cervical retractions.       Problem List Patient Active Problem List   Diagnosis Date Noted  . Cervicalgia 10/21/2013  . Neck pain 05/29/2013  . HTN (hypertension) 05/22/2013  . High triglycerides 05/22/2013  . Heme positive stool 03/30/2013  . Back pain 02/23/2013  . Stiffness of joints, not elsewhere  classified, multiple sites 02/23/2013  . Weakness of both legs 02/23/2013    PT - End of Session Equipment Utilized During Treatment: Gait belt Activity Tolerance: Patient tolerated treatment well PT Plan of Care PT Home Exercise Plan: thoracic spine matrix and 3 way Pectoral stretch 10x each PT Patient Instructions: twice daily   Teena Irani, PTA/CLT 10/28/2013, 3:58 PM

## 2013-10-29 ENCOUNTER — Ambulatory Visit (HOSPITAL_COMMUNITY)
Admission: RE | Admit: 2013-10-29 | Discharge: 2013-10-29 | Disposition: A | Discharge status: Home / Self Care | Payer: Self-pay | Source: Ambulatory Visit | Attending: Family Medicine | Admitting: Family Medicine

## 2013-10-29 NOTE — Progress Notes (Signed)
Physical Therapy Treatment Patient Details  Name: Wayne Alvarez MRN: [id number] Date of Birth: 1950/09/29  Today's Date: 10/29/2013 Time: 7989-2119 PT Time Calculation (min): 38 min Visit#: 3 of 12  Re-eval: 11/20/13 Authorization: [employer] medicare  Authorization Visit#: 3 of 10  Charges: therex 38'  Subjective: Symptoms/Limitations Symptoms: PT reports no pain today.  contineus to be compliant with HEP and swimming on own.  Pain Assessment Currently in Pain?: No/denies   Exercise/Treatments Stretches Other Neck Stretches: 3D Thoracic spine excursions 10x Other Neck Stretches: 3way Pectoral stretch 10x 3 seconds Machines for Strengthening UBE (Upper Arm Bike): 4 minutes backward level 3 Theraband Exercises Scapula Retraction: 10 reps;Green Shoulder Extension: 10 reps;Green Rows: 10 reps;Green Standing Exercises Other Standing Exercises: cervical retractions 10 reps Other Standing Exercises: UE flexion against wall 10 reps  Physical Therapy Assessment and Plan PT Assessment and Plan Clinical Impression Statement: continued improviment with increasing ROM.  Noted weakness with UE flexion against wall.  Added seated cervical isometrics and cervical retractions to increase stretngth.  Pt with good form.  continues to require therapist assistance for proper form with theraband.   PT Plan: Introduce and progress mobility exercises next session and strenghteing exercises to address the above listed limitations. Progress UE stab exercises and add wall push ups.      Problem List Patient Active Problem List   Diagnosis Date Noted  . Cervicalgia 10/21/2013  . Neck pain 05/29/2013  . HTN (hypertension) 05/22/2013  . High triglycerides 05/22/2013  . Heme positive stool 03/30/2013  . Back pain 02/23/2013  . Stiffness of joints, not elsewhere classified, multiple sites 02/23/2013  . Weakness of both legs 02/23/2013    PT - End of Session Equipment Utilized During Treatment:  Gait belt Activity Tolerance: Patient tolerated treatment well PT Plan of Care PT Home Exercise Plan: thoracic spine matrix and 3 way Pectoral stretch 10x each PT Patient Instructions: twice daily   Teena Irani, PTA/CLT 10/29/2013, 2:28 PM

## 2013-11-02 ENCOUNTER — Ambulatory Visit (HOSPITAL_COMMUNITY)
Admission: RE | Admit: 2013-11-02 | Discharge: 2013-11-02 | Disposition: A | Discharge status: Home / Self Care | Payer: Self-pay | Source: Ambulatory Visit | Attending: Orthopedic Surgery | Admitting: Orthopedic Surgery

## 2013-11-02 NOTE — Progress Notes (Signed)
Physical Therapy Discharge  Patient Details  Name: Wayne Alvarez MRN: [id number] Date of Birth: [birth year]  Today's Date: 11/02/2013 Time: 1600-1630 PT Time Calculation (min): 30 min              Visit#: 4 of 12  Re-eval: 11/20/13 Assessment Diagnosis: pain and Lt UE numbness and tingling s/p cervical spine fusion Surgical Date: 06/27/13 Next MD Visit: 10/30/13  Prior Therapy: no, just for back.  Authorization: [employer] medicare    Authorization Visit#: 4 of 10  Charges:  ROM/strength, self care 15 minutes   Subjective Symptoms/Limitations Symptoms: Pt states MD has discharged him from therapy.  Pt states he no longer has cervical pain, only occasional numbness into Lt fingers. Pain Assessment Currently in Pain?: No/denies   Objective LUE Strength Left Shoulder Flexion: 5/5 (was 4/5) Left Shoulder Extension: 5/5 (was 4/5) Left Shoulder ABduction: 5/5 (was 4/5) Left Shoulder Horizontal ABduction: 5/5 (was 4/5) Cervical AROM Cervical Flexion: 50 (was 20) Cervical Extension: 50 (was 30) Cervical - Right Side Bend: 35 (was 30) Cervical - Left Side Bend: 35 (was 17) Cervical - Right Rotation: 65 (was 56) Cervical - Left Rotation: 65 (was 65)   Physical Therapy Assessment and Plan PT Assessment and Plan Clinical Impression Statement: Strength and ROM of cervical and Lt UE is now WNL.  Pt continues to have occasional numbness into Lt thumb, pointer and middle fingers.  Pt unable to recognize causitive factors for symptoms.  Pt reports he no longer has cervical pain.  Most functional limitations at this point are due to his lumbar pain and knee arthrisits.  Pt is independent with HEP and requests to be discharged from therapy at this time.   PT Plan: Patient requests discharge to HEP at this time.      Goals Home Exercise Program Pt/caregiver will Perform Home Exercise Program: For increased ROM;For increased strengthening PT Goal: Perform Home Exercise Program - Progress:  Met PT Short Term Goals Time to Complete Short Term Goals: 4 weeks PT Short Term Goal 1: Patient will be able to forward flex chin to chest and extend head to 50 degrees indicating improved spine cervical spine mobility PT Short Term Goal 1 - Progress: Met PT Short Term Goal 2: Patient will be able to sit > 1 hour without pain indicating improved posture PT Short Term Goal 2 - Progress: Partly met (can sit max of 45 minutes) PT Short Term Goal 3: Patient will be able to stand/walk greater than 1 hour without pain>2/10 to be able to make dinner and buy groceries PT Short Term Goal 3 - Progress: Partly met (neck doesnt bother patient with activity, however Rt knee and back does. ) PT Short Term Goal 4: patient will be able to horizontally abduct his Lt shoulder to > 30 degrees to indicated improved shoulder mobility with no complaint of UE numbness and tingling to indicate decreased thoracic outlet syndrome.  PT Short Term Goal 4 - Progress: Met  Problem List Patient Active Problem List   Diagnosis Date Noted  . Cervicalgia 10/21/2013  . Neck pain 05/29/2013  . HTN (hypertension) 05/22/2013  . High triglycerides 05/22/2013  . Heme positive stool 03/30/2013  . Back pain 02/23/2013  . Stiffness of joints, not elsewhere classified, multiple sites 02/23/2013  . Weakness of both legs 02/23/2013    PT - End of Session Equipment Utilized During Treatment: Gait belt  GP Functional Assessment Tool Used: Clinical judgement FOTO: 53% limited (was 53% limited) Functional Limitation:  Other PT primary Other PT Primary Goal Status [phone]): At least 20 percent but less than 40 percent impaired, limited or restricted Other PT Primary Discharge Status [phone]): At least 20 percent but less than 40 percent impaired, limited or restricted  Teena Irani, PTA/CLT 11/02/2013, 4:37 PM

## 2013-11-04 ENCOUNTER — Ambulatory Visit (HOSPITAL_COMMUNITY): Payer: Self-pay | Admitting: Physical Therapy

## 2013-11-04 NOTE — Progress Notes (Signed)
Physical Therapy Discharge  Patient Details  Name: GEDALYA JIM MRN: [id number] Date of Birth: [birth year]  Today's Date: 11/02/2013 Time: 1600-1630 PT Time Calculation (min): 30 min              Visit#: 4 of 12  Re-eval: 11/20/13 Assessment Diagnosis: pain and Lt UE numbness and tingling s/p cervical spine fusion Surgical Date: 06/27/13 Next MD Visit: 10/30/13  Prior Therapy: no, just for back.  Authorization: [employer] medicare    Authorization Visit#: 4 of 10  Charges:  ROM/strength, self care 15 minutes   Subjective Symptoms/Limitations Symptoms: Pt states MD has discharged him from therapy.  Pt states he no longer has cervical pain, only occasional numbness into Lt fingers. Pain Assessment Currently in Pain?: No/denies   Objective LUE Strength Left Shoulder Flexion: 5/5 (was 4/5) Left Shoulder Extension: 5/5 (was 4/5) Left Shoulder ABduction: 5/5 (was 4/5) Left Shoulder Horizontal ABduction: 5/5 (was 4/5) Cervical AROM Cervical Flexion: 50 (was 20) Cervical Extension: 50 (was 30) Cervical - Right Side Bend: 35 (was 30) Cervical - Left Side Bend: 35 (was 17) Cervical - Right Rotation: 65 (was 56) Cervical - Left Rotation: 65 (was 65)   Physical Therapy Assessment and Plan PT Assessment and Plan Clinical Impression Statement: Strength and ROM of cervical and Lt UE is now WNL.  Pt continues to have occasional numbness into Lt thumb, pointer and middle fingers.  Pt unable to recognize causitive factors for symptoms.  Pt reports he no longer has cervical pain.  Most functional limitations at this point are due to his lumbar pain and knee arthrisits.  Pt is independent with HEP and requests to be discharged from therapy at this time.   PT Plan: Patient requests discharge to HEP at this time.      Goals Home Exercise Program Pt/caregiver will Perform Home Exercise Program: For increased ROM;For increased strengthening PT Goal: Perform Home Exercise Program - Progress:  Met PT Short Term Goals Time to Complete Short Term Goals: 4 weeks PT Short Term Goal 1: Patient will be able to forward flex chin to chest and extend head to 50 degrees indicating improved spine cervical spine mobility PT Short Term Goal 1 - Progress: Met PT Short Term Goal 2: Patient will be able to sit > 1 hour without pain indicating improved posture PT Short Term Goal 2 - Progress: Partly met (can sit max of 45 minutes) PT Short Term Goal 3: Patient will be able to stand/walk greater than 1 hour without pain>2/10 to be able to make dinner and buy groceries PT Short Term Goal 3 - Progress: Partly met (neck doesnt bother patient with activity, however Rt knee and back does. ) PT Short Term Goal 4: patient will be able to horizontally abduct his Lt shoulder to > 30 degrees to indicated improved shoulder mobility with no complaint of UE numbness and tingling to indicate decreased thoracic outlet syndrome.  PT Short Term Goal 4 - Progress: Met  Problem List Patient Active Problem List   Diagnosis Date Noted  . Cervicalgia 10/21/2013  . Neck pain 05/29/2013  . HTN (hypertension) 05/22/2013  . High triglycerides 05/22/2013  . Heme positive stool 03/30/2013  . Back pain 02/23/2013  . Stiffness of joints, not elsewhere classified, multiple sites 02/23/2013  . Weakness of both legs 02/23/2013    PT - End of Session Equipment Utilized During Treatment: Gait belt  GP Functional Assessment Tool Used: Clinical judgement FOTO: 53% limited (was 53% limited) Functional Limitation:  Other PT primary Other PT Primary Goal Status [phone]): At least 20 percent but less than 40 percent impaired, limited or restricted Other PT Primary Discharge Status [phone]): At least 20 percent but less than 40 percent impaired, limited or restricted  Teena Irani, PTA/CLT 11/02/2013, 4:37 PM  Devona Konig PT DPT

## 2013-11-06 ENCOUNTER — Ambulatory Visit (HOSPITAL_COMMUNITY): Payer: Self-pay | Admitting: Physical Therapy

## 2013-11-09 ENCOUNTER — Ambulatory Visit (HOSPITAL_COMMUNITY): Payer: Self-pay

## 2013-11-11 ENCOUNTER — Ambulatory Visit (HOSPITAL_COMMUNITY): Payer: Self-pay | Admitting: Physical Therapy

## 2013-11-13 ENCOUNTER — Ambulatory Visit (HOSPITAL_COMMUNITY): Payer: Self-pay | Admitting: Physical Therapy

## 2013-11-16 ENCOUNTER — Ambulatory Visit (HOSPITAL_COMMUNITY): Payer: Self-pay

## 2013-11-18 ENCOUNTER — Ambulatory Visit (HOSPITAL_COMMUNITY): Payer: Self-pay

## 2013-11-25 ENCOUNTER — Ambulatory Visit (HOSPITAL_COMMUNITY): Payer: Self-pay | Admitting: Physical Therapy

## 2014-04-21 IMAGING — CR DG CHEST 2V
2 series · 2 of 2 positions shown · non-contrast
Comparison: 12/25/2010

CLINICAL DATA: Preoperative evaluation for neck surgery

EXAM:
CHEST  2 VIEW

[w chest pa]
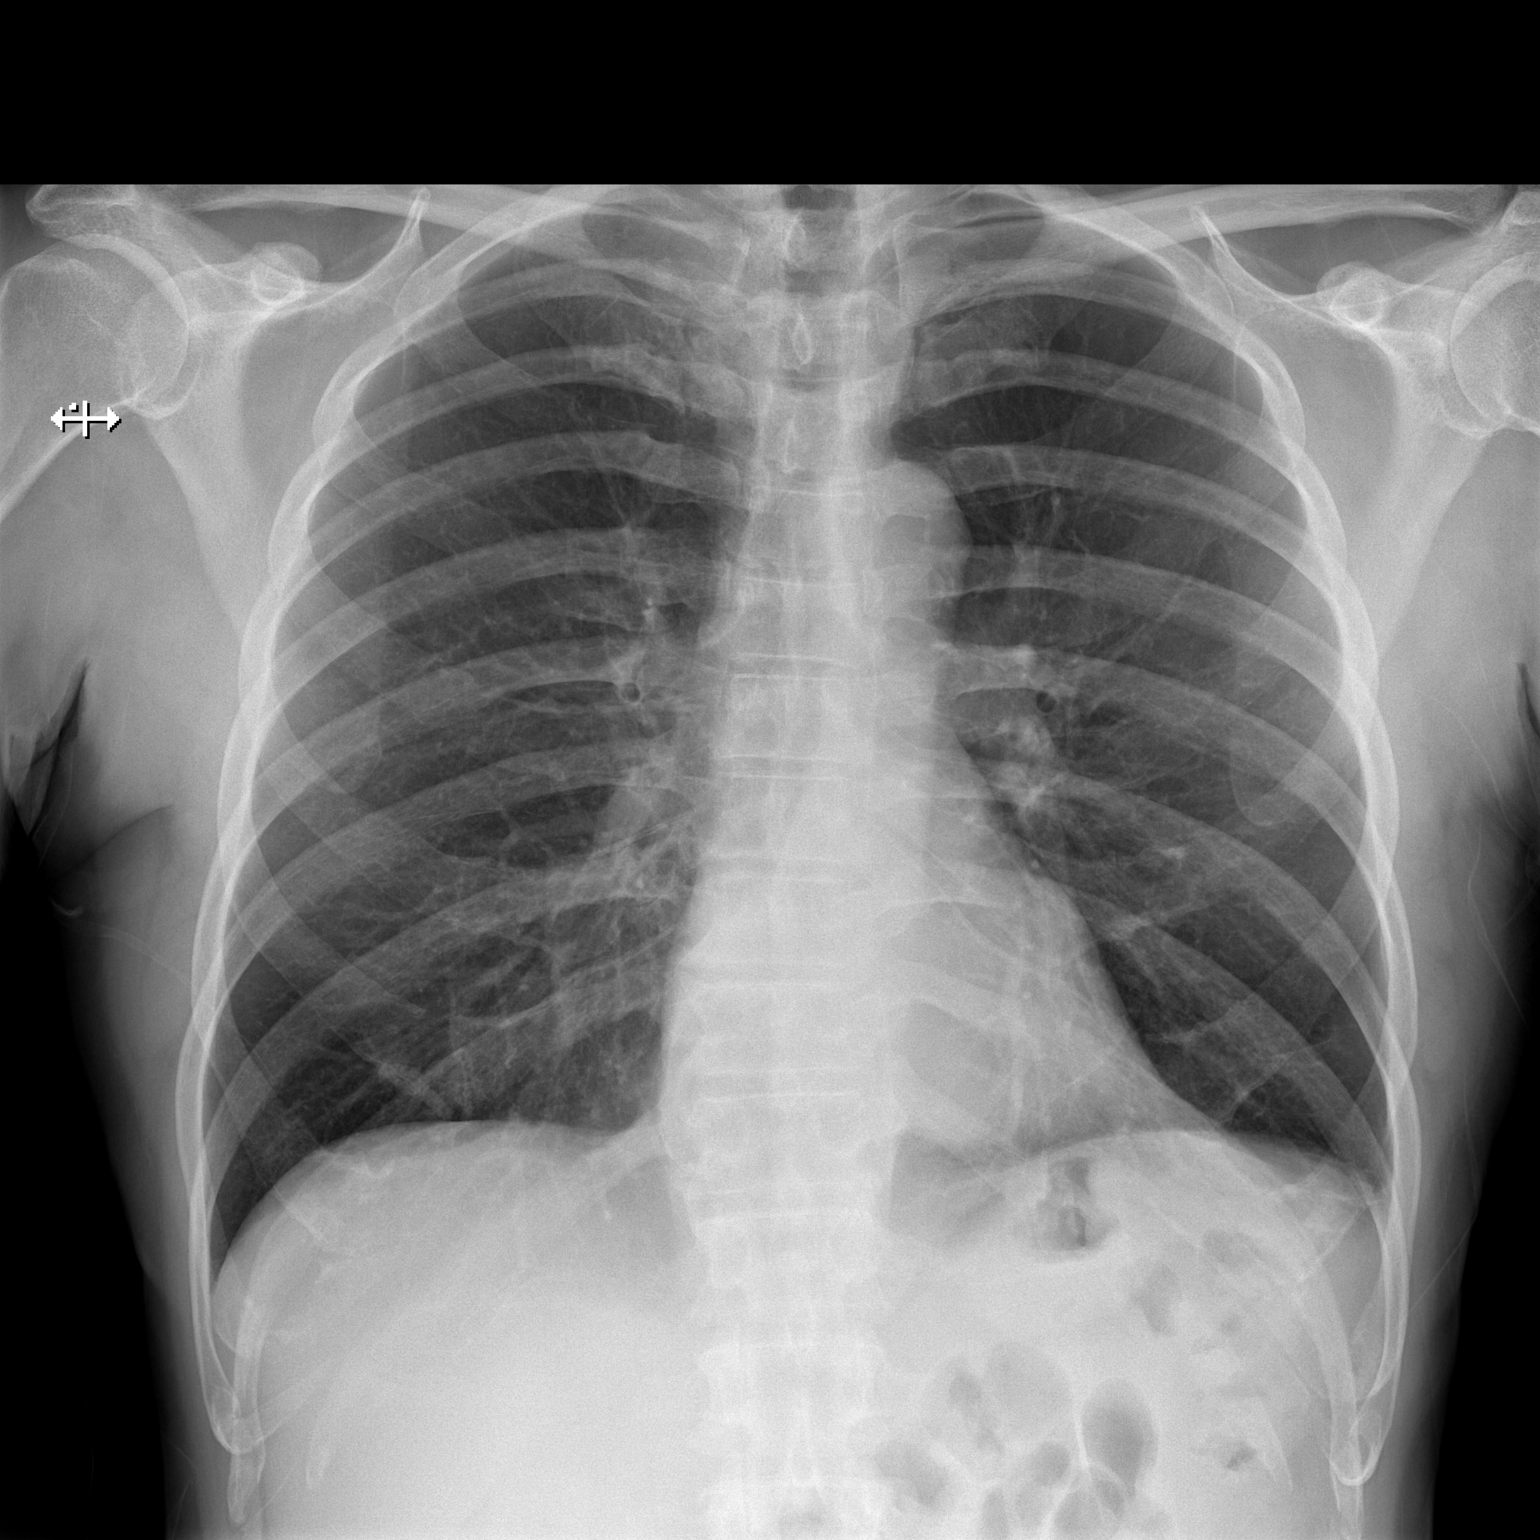

[w chest lat]
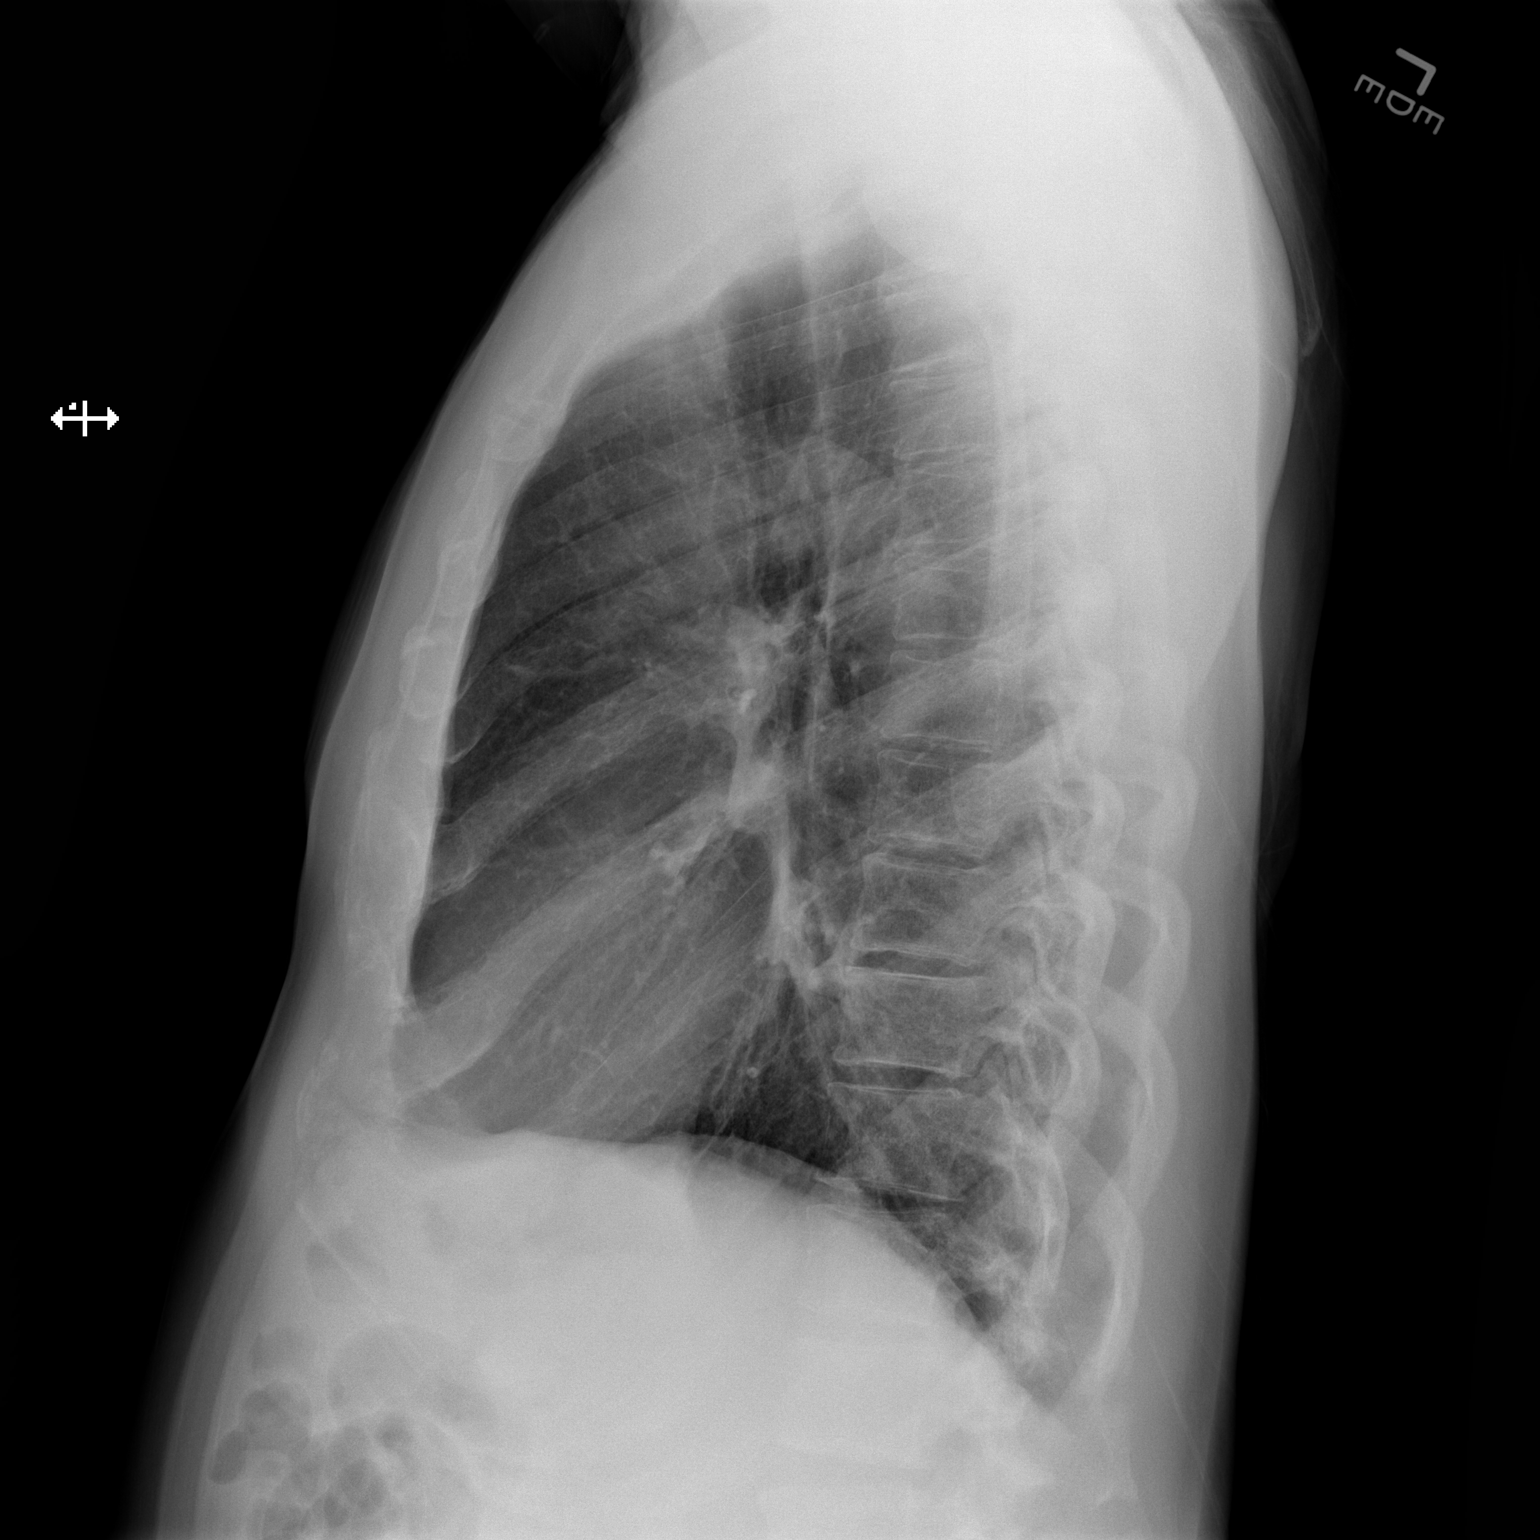

[2 of 2 positions shown; findings below may reference images not displayed]

FINDINGS: The heart size and mediastinal contours are within normal limits.
Both lungs are clear. The visualized skeletal structures are
unremarkable.
IMPRESSION: No active cardiopulmonary disease.

## 2014-04-21 IMAGING — CR DG CERVICAL SPINE 2 OR 3 VIEWS
2 series · 2 of 2 positions shown · non-contrast
Comparison: CT C SPINE W/O CM dated 02/12/2013

CLINICAL DATA: Preoperative evaluation prior cervical fusion.

EXAM:
CERVICAL SPINE - 2-3 VIEW

[w cervical spine lat]
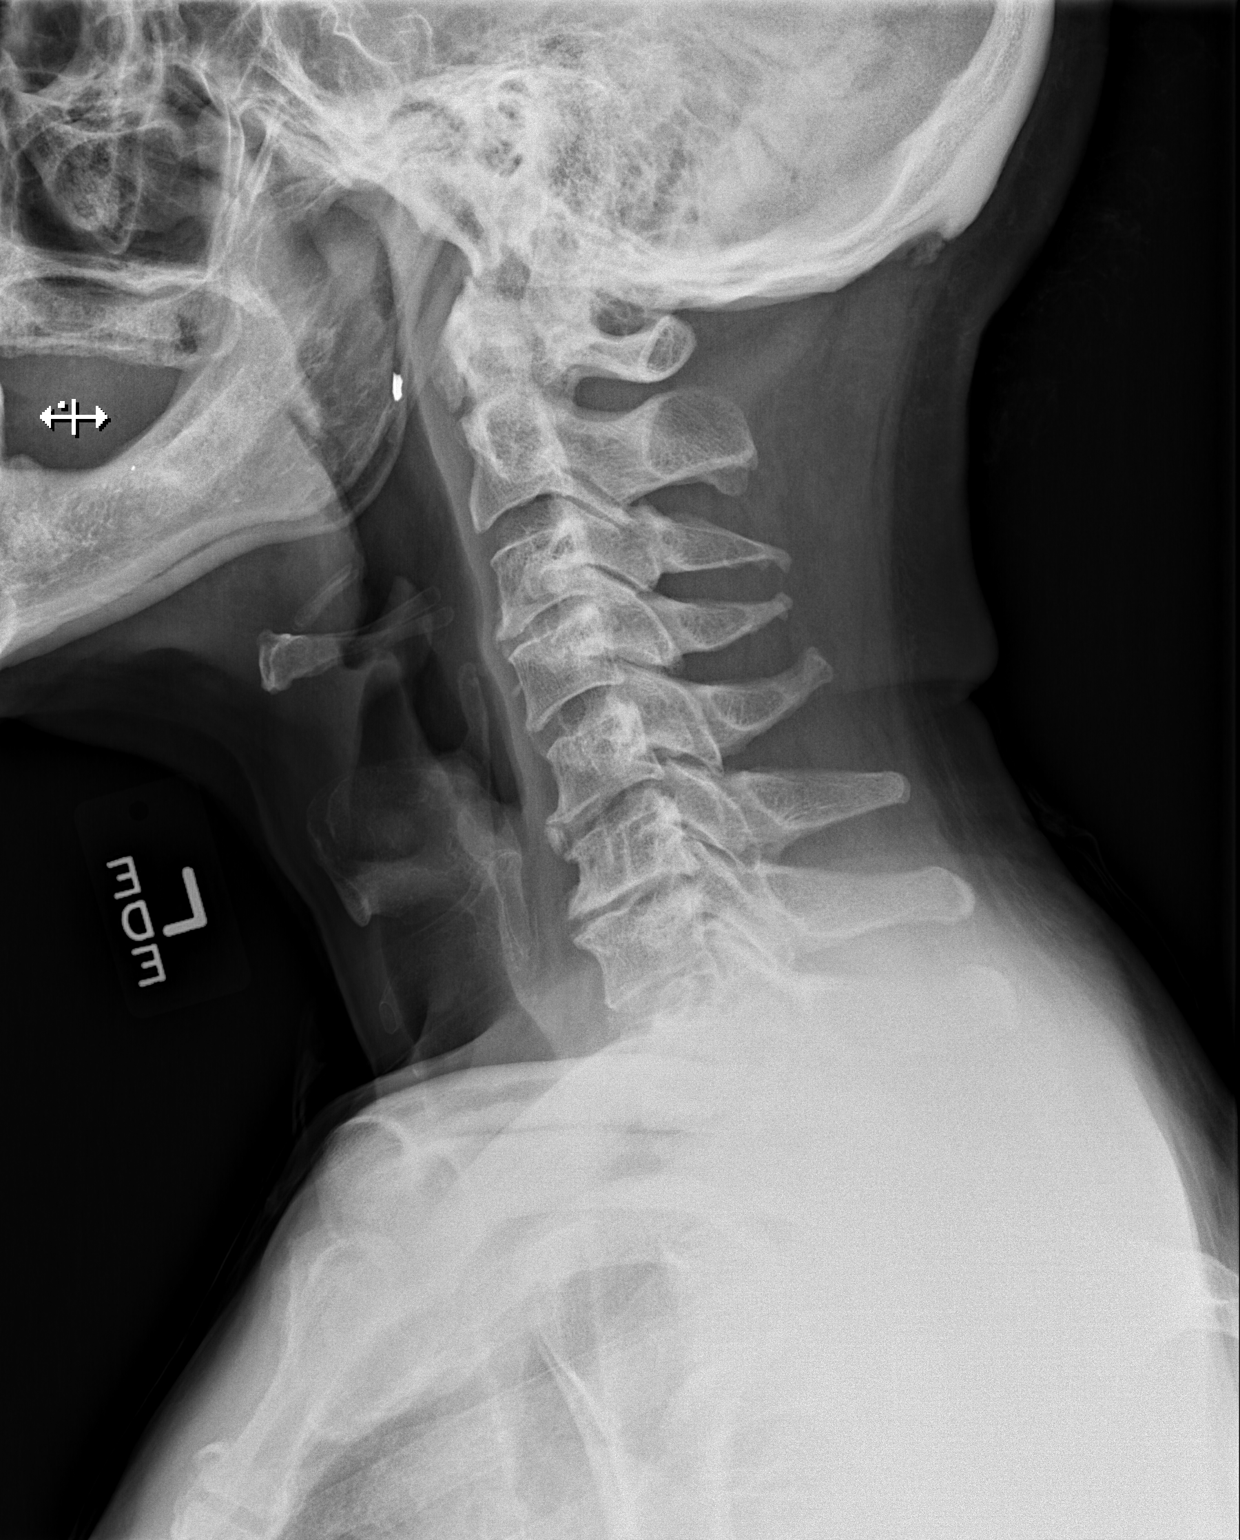

[w cervical spine ap]
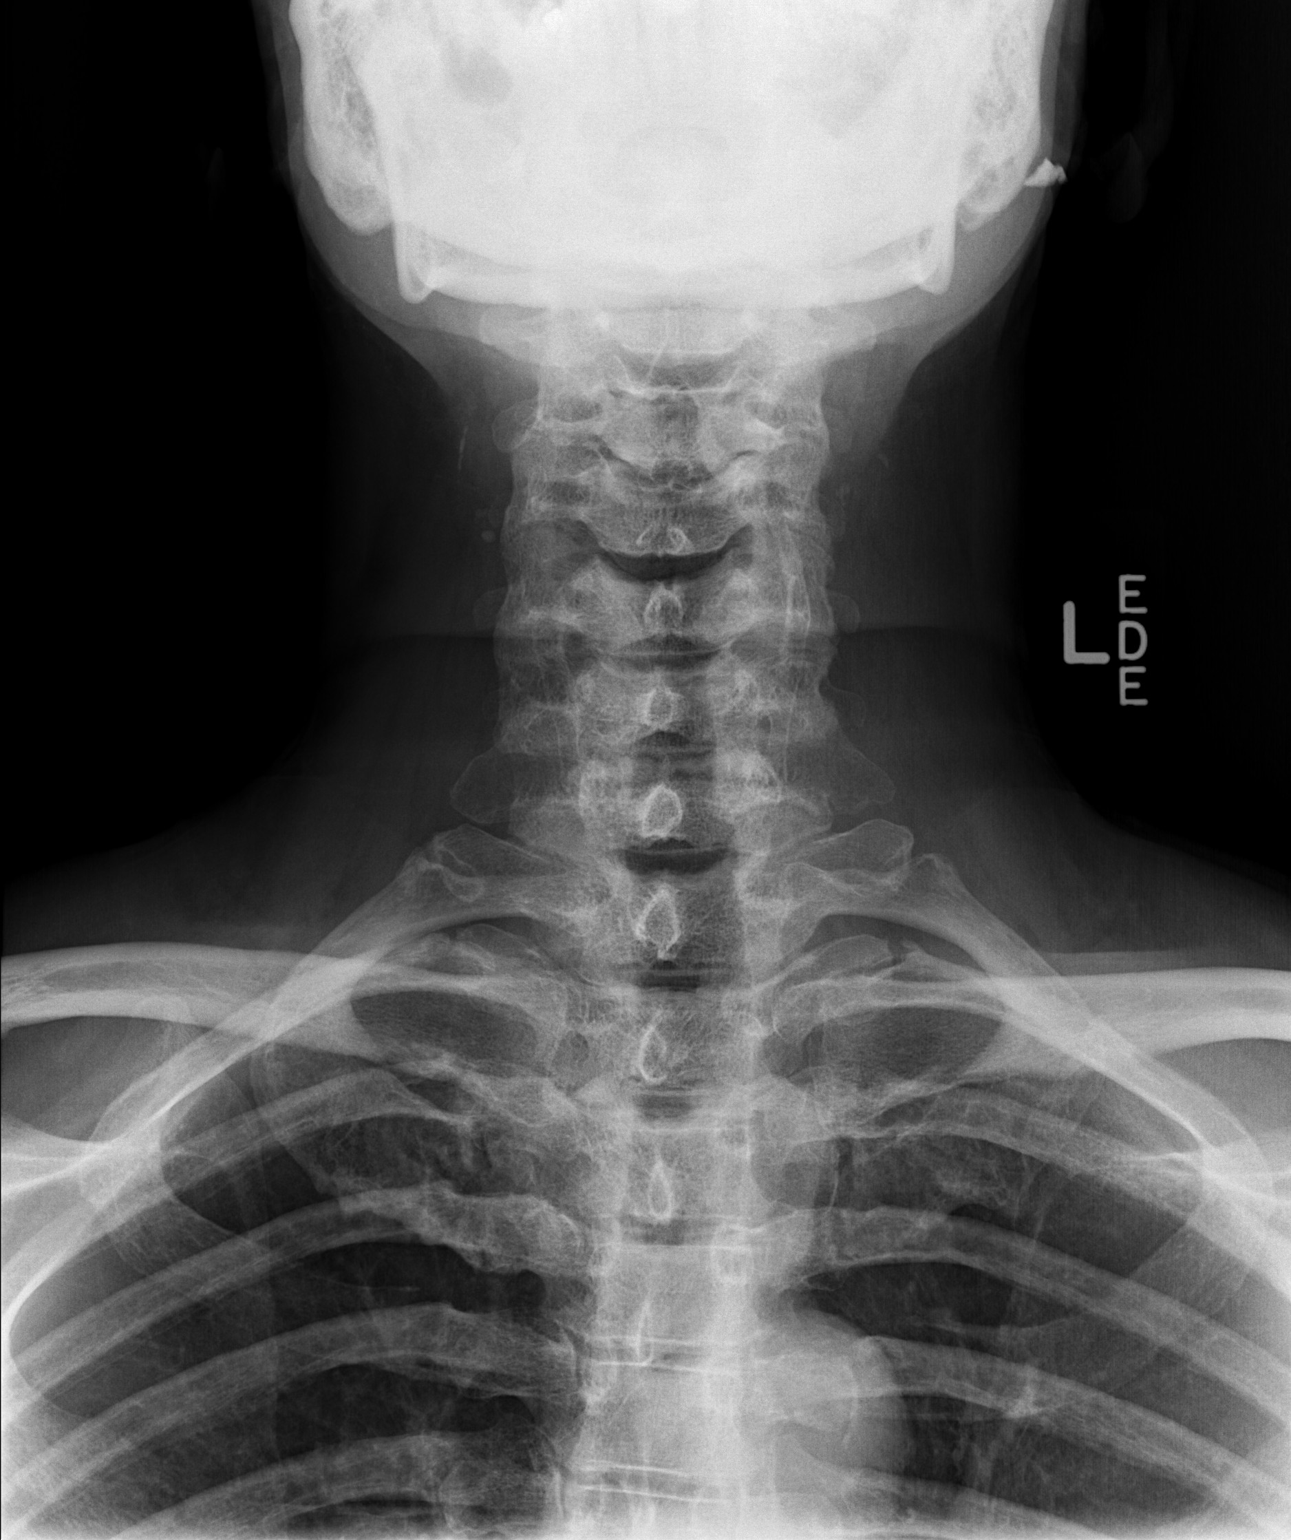

[2 of 2 positions shown; findings below may reference images not displayed]

FINDINGS: Straightening of the usual cervical lordosis. Anatomic posterior
alignment. No visible fractures. Disc space narrowing and endplate
hypertrophic changes at C3-4 (mild), C5-6 (mild), and C6-7 (moderate
to severe). Ossification in the anterior annular fibers at C3-4,
C4-5, and C5-6. Normal prevertebral soft tissues. No static signs of
instability. Atherosclerotic calcification involving the carotid
arteries bilaterally.
IMPRESSION: 1. Straightening of the usual lordosis which may reflect positioning
and/or spasm. No evidence of fracture or static signs of
instability.
2. Multilevel degenerative disc disease and spondylosis as described
above, worst at C6-7.
3. Bilateral carotid atherosclerosis.

## 2014-04-29 IMAGING — RF DG C-ARM GT 120 MIN
1 series · 2 of 2 positions shown · non-contrast
Comparison: Two views cervical spine 05/21/2013.

CLINICAL DATA: C3-4 ACDF.

EXAM:
CERVICAL SPINE - 2-3 VIEW; DG C-ARM GT 120 MIN

[Series 1: run · 2 of 2 slices shown]
[im 1/2]
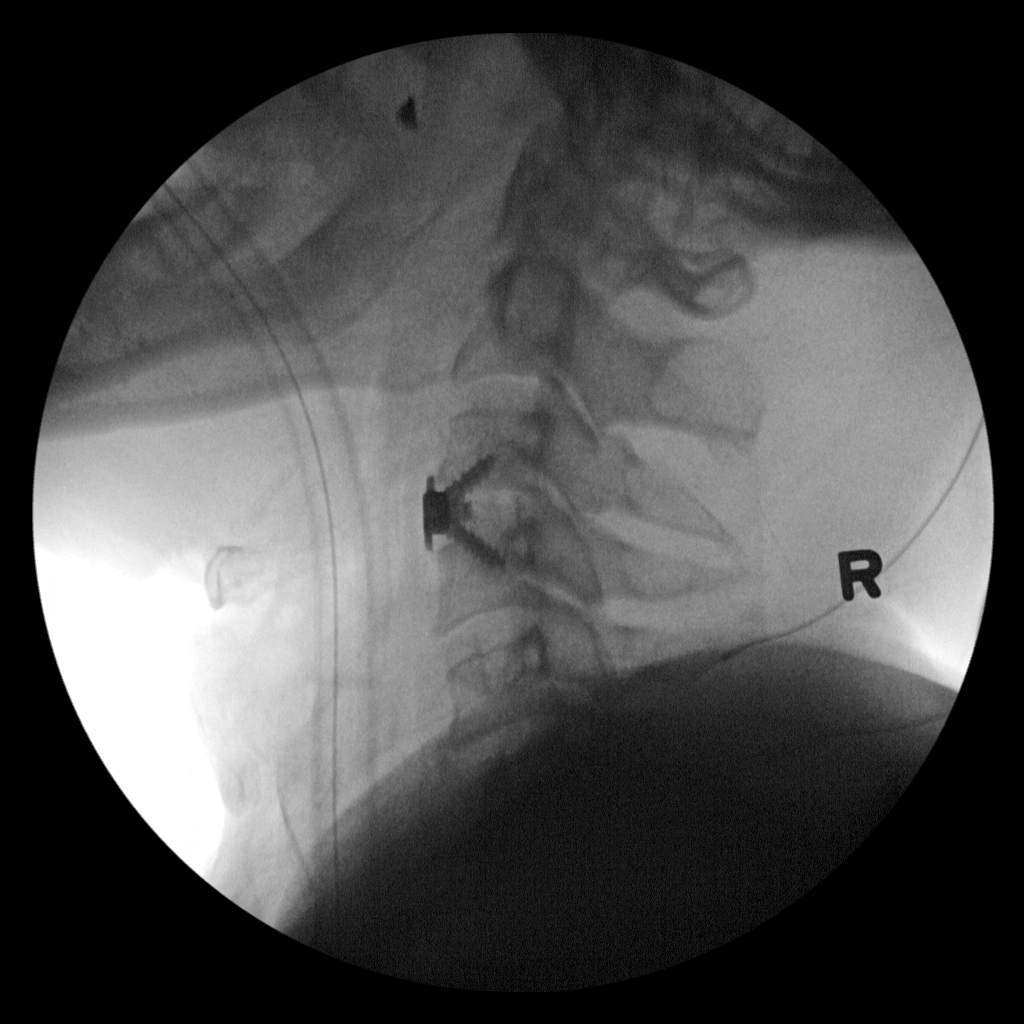
[im 2/2]
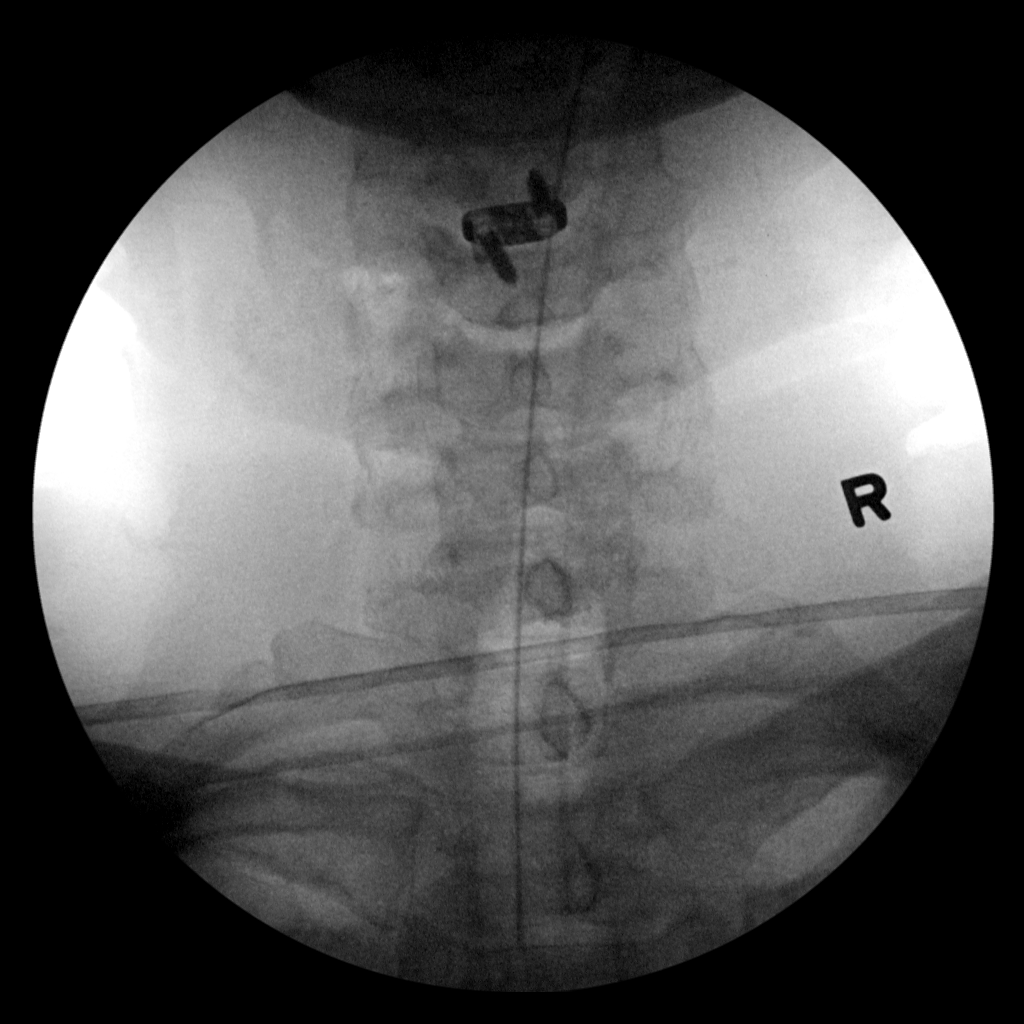

[2 of 2 positions shown; findings below may reference images not displayed]

FINDINGS: 2 intraoperative fluoroscopic spot views of the cervical spine
demonstrate anterior plate and screws and interbody spacer for C3-4
fusion. No acute abnormality is identified.
IMPRESSION: C3-4 fusion.

## 2016-03-27 ENCOUNTER — Encounter: Payer: Self-pay | Admitting: Internal Medicine

## 2019-05-13 ENCOUNTER — Ambulatory Visit: Payer: Self-pay | Attending: Internal Medicine

## 2019-05-13 ENCOUNTER — Other Ambulatory Visit: Payer: Self-pay

## 2019-05-13 DIAGNOSIS — Z20822 Contact with and (suspected) exposure to covid-19: Secondary | ICD-10-CM

## 2019-05-14 LAB — NOVEL CORONAVIRUS, NAA: SARS-CoV-2, NAA: NOT DETECTED

## 2021-09-18 ENCOUNTER — Emergency Department (HOSPITAL_COMMUNITY): Payer: Medicare HMO

## 2021-09-18 ENCOUNTER — Other Ambulatory Visit: Payer: Self-pay

## 2021-09-18 ENCOUNTER — Emergency Department (HOSPITAL_COMMUNITY)
Admission: EM | Admit: 2021-09-18 | Discharge: 2021-09-18 | Disposition: A | Discharge status: Home / Self Care | Payer: Medicare HMO | Attending: Emergency Medicine | Admitting: Emergency Medicine

## 2021-09-18 ENCOUNTER — Ambulatory Visit
Admission: EM | Admit: 2021-09-18 | Discharge: 2021-09-18 | Disposition: A | Discharge status: Other Acute Inpatient Hospital | Payer: Medicare HMO

## 2021-09-18 ENCOUNTER — Encounter (HOSPITAL_COMMUNITY): Payer: Self-pay | Admitting: Emergency Medicine

## 2021-09-18 DIAGNOSIS — R000 Tachycardia, unspecified: Secondary | ICD-10-CM | POA: Diagnosis not present

## 2021-09-18 DIAGNOSIS — I471 Supraventricular tachycardia: Secondary | ICD-10-CM | POA: Insufficient documentation

## 2021-09-18 DIAGNOSIS — Z7901 Long term (current) use of anticoagulants: Secondary | ICD-10-CM | POA: Insufficient documentation

## 2021-09-18 DIAGNOSIS — I472 Ventricular tachycardia, unspecified: Secondary | ICD-10-CM | POA: Diagnosis not present

## 2021-09-18 DIAGNOSIS — R079 Chest pain, unspecified: Secondary | ICD-10-CM | POA: Diagnosis not present

## 2021-09-18 DIAGNOSIS — I10 Essential (primary) hypertension: Secondary | ICD-10-CM | POA: Insufficient documentation

## 2021-09-18 DIAGNOSIS — R52 Pain, unspecified: Secondary | ICD-10-CM | POA: Diagnosis not present

## 2021-09-18 DIAGNOSIS — R072 Precordial pain: Secondary | ICD-10-CM | POA: Diagnosis present

## 2021-09-18 DIAGNOSIS — I4892 Unspecified atrial flutter: Secondary | ICD-10-CM | POA: Insufficient documentation

## 2021-09-18 LAB — CBC WITH DIFFERENTIAL/PLATELET
Abs Immature Granulocytes: 0.02 10*3/uL (ref 0.00–0.07)
Basophils Absolute: 0 10*3/uL (ref 0.0–0.1)
Basophils Relative: 0 %
Eosinophils Absolute: 0.1 10*3/uL (ref 0.0–0.5)
Eosinophils Relative: 2 %
HCT: 39.2 % (ref 39.0–52.0)
Hemoglobin: 13.5 g/dL (ref 13.0–17.0)
Immature Granulocytes: 0 %
Lymphocytes Relative: 26 %
Lymphs Abs: 1.8 10*3/uL (ref 0.7–4.0)
MCH: 32.4 pg (ref 26.0–34.0)
MCHC: 34.4 g/dL (ref 30.0–36.0)
MCV: 94 fL (ref 80.0–100.0)
Monocytes Absolute: 0.6 10*3/uL (ref 0.1–1.0)
Monocytes Relative: 8 %
Neutro Abs: 4.3 10*3/uL (ref 1.7–7.7)
Neutrophils Relative %: 64 %
Platelets: 179 10*3/uL (ref 150–400)
RBC: 4.17 MIL/uL — ABNORMAL LOW (ref 4.22–5.81)
RDW: 13.8 % (ref 11.5–15.5)
WBC: 6.8 10*3/uL (ref 4.0–10.5)
nRBC: 0 % (ref 0.0–0.2)

## 2021-09-18 LAB — COMPREHENSIVE METABOLIC PANEL
ALT: 31 U/L (ref 0–44)
AST: 29 U/L (ref 15–41)
Albumin: 3.6 g/dL (ref 3.5–5.0)
Alkaline Phosphatase: 50 U/L (ref 38–126)
Anion gap: 4 — ABNORMAL LOW (ref 5–15)
BUN: 18 mg/dL (ref 8–23)
CO2: 24 mmol/L (ref 22–32)
Calcium: 8.2 mg/dL — ABNORMAL LOW (ref 8.9–10.3)
Chloride: 110 mmol/L (ref 98–111)
Creatinine, Ser: 1.28 mg/dL — ABNORMAL HIGH (ref 0.61–1.24)
GFR, Estimated: 60 mL/min — ABNORMAL LOW (ref >60)
Glucose, Bld: 132 mg/dL — ABNORMAL HIGH (ref 70–99)
Potassium: 3.6 mmol/L (ref 3.5–5.1)
Sodium: 138 mmol/L (ref 135–145)
Total Bilirubin: 0.5 mg/dL (ref 0.3–1.2)
Total Protein: 6.3 g/dL — ABNORMAL LOW (ref 6.5–8.1)

## 2021-09-18 LAB — LIPASE, BLOOD: Lipase: 28 U/L (ref 11–51)

## 2021-09-18 LAB — MAGNESIUM: Magnesium: 2.1 mg/dL (ref 1.7–2.4)

## 2021-09-18 LAB — TROPONIN I (HIGH SENSITIVITY)
Troponin I (High Sensitivity): 105 ng/L (ref <18)
Troponin I (High Sensitivity): 103 ng/L (ref <18)

## 2021-09-18 MED ORDER — METOPROLOL SUCCINATE ER 25 MG PO TB24: 25.0000 mg | ORAL_TABLET | Freq: Every day | ORAL | 2 refills | Status: DC

## 2021-09-18 MED ORDER — METOPROLOL TARTRATE 25 MG PO TABS: 12.5000 mg | ORAL_TABLET | Freq: Once | ORAL | Status: DC

## 2021-09-18 MED ORDER — METOPROLOL SUCCINATE ER 25 MG PO TB24
25.0000 mg | ORAL_TABLET | Freq: Once | ORAL | Status: AC
  Administered 2021-09-18: 25 mg via ORAL
  Filled 2021-09-18: qty 1

## 2021-09-18 MED ORDER — METOPROLOL SUCCINATE ER 25 MG PO TB24: 25.0000 mg | ORAL_TABLET | Freq: Every day | ORAL | Status: DC

## 2021-09-18 MED ORDER — POTASSIUM CHLORIDE CRYS ER 20 MEQ PO TBCR
EXTENDED_RELEASE_TABLET | ORAL | Status: AC
  Filled 2021-09-18: qty 2

## 2021-09-18 MED ORDER — APIXABAN 5 MG PO TABS: 5.0000 mg | ORAL_TABLET | Freq: Two times a day (BID) | ORAL | 2 refills | Status: DC

## 2021-09-18 MED ORDER — APIXABAN 5 MG PO TABS
5.0000 mg | ORAL_TABLET | Freq: Once | ORAL | Status: AC
  Administered 2021-09-18: 5 mg via ORAL
  Filled 2021-09-18: qty 1

## 2021-09-18 MED ORDER — POTASSIUM CHLORIDE CRYS ER 20 MEQ PO TBCR
40.0000 meq | EXTENDED_RELEASE_TABLET | Freq: Once | ORAL | Status: AC
  Administered 2021-09-18: 40 meq via ORAL

## 2021-09-18 NOTE — ED Notes (Signed)
Pt ambulatory to waiting room. Pt verbalized understanding of discharge instructions.   

## 2021-09-18 NOTE — ED Triage Notes (Signed)
Pt presents with c/o epigastric pain that began Saturday,pt tachycardic upon assessment Patient is being discharged from the Urgent Care and sent to the Emergency Department via ems . Per C Thurston Hole, patient is in need of higher level of care due to abnormal ekg . Patient is aware and verbalizes understanding of plan of care.  Vitals:   09/18/21 0818  BP: 119/82  Pulse: (!) 196  Resp: 20  Temp: 98.1 F (36.7 C)  SpO2: 95%

## 2021-09-18 NOTE — ED Notes (Signed)
Date and time results received: 09/18/21 1050 (use smartphrase ".now" to insert current time)  Test: troponin Critical Value: 105  Name of Provider Notified: Dr. Doren Custard  Orders Received? Or Actions Taken?: no/na

## 2021-09-18 NOTE — ED Triage Notes (Signed)
Pt from [facility] by EMS with reports of SVT and SHOB. HR initially 198. Pt was given '20mg'$  cardizem en route. Pt HR now 102. BP stable at 808 systolic.

## 2021-09-18 NOTE — Discharge Instructions (Addendum)
While here in the emergency room, you had atrial flutter on the cardiac monitor.  This seems is likely something that comes and goes.  This does put you at increased risk of stroke.  A prescription for Eliquis was sent to your pharmacy.  This is a blood thinning medication that will lower your stroke risk.  It will put you at increased risk of bleeding.  Keep this in mind and return to the emergency department for any concerning falls or injuries.  Another medication that was sent to your pharmacy is for metoprolol.  This is a medication that will lower the risk of going back into a rapid heart rate.  A referral was sent to the cardiology office.  If you do not hear from them by tomorrow, call the number below.  At your follow-up appointment, you can discuss your recent symptoms and the new medications that you were started on.  If you develop any return of concerning symptoms, please return to the emergency department.   Information on my medicine - ELIQUIS (apixaban)  This medication education was reviewed with me or my healthcare representative as part of my discharge preparation.     Why was Eliquis prescribed for you? Eliquis was prescribed for you to reduce the risk of a blood clot forming that can cause a stroke if you have a medical condition called atrial fibrillation (a type of irregular heartbeat).  What do You need to know about Eliquis ? Take your Eliquis TWICE DAILY - one tablet in the morning and one tablet in the evening with or without food. If you have difficulty swallowing the tablet whole please discuss with your pharmacist how to take the medication safely.  Take Eliquis exactly as prescribed by your doctor and DO NOT stop taking Eliquis without talking to the doctor who prescribed the medication.  Stopping may increase your risk of developing a stroke.  Refill your prescription before you run out.  After discharge, you should have regular check-up appointments with your  healthcare provider that is prescribing your Eliquis.  In the future your dose may need to be changed if your kidney function or weight changes by a significant amount or as you get older.  What do you do if you miss a dose? If you miss a dose, take it as soon as you remember on the same day and resume taking twice daily.  Do not take more than one dose of ELIQUIS at the same time to make up a missed dose.  Important Safety Information A possible side effect of Eliquis is bleeding. You should call your healthcare provider right away if you experience any of the following: Bleeding from an injury or your nose that does not stop. Unusual colored urine (red or dark brown) or unusual colored stools (red or black). Unusual bruising for unknown reasons. A serious fall or if you hit your head (even if there is no bleeding).  Some medicines may interact with Eliquis and might increase your risk of bleeding or clotting while on Eliquis. To help avoid this, consult your healthcare provider or pharmacist prior to using any new prescription or non-prescription medications, including herbals, vitamins, non-steroidal anti-inflammatory drugs (NSAIDs) and supplements.  This website has more information on Eliquis (apixaban): http://www.eliquis.com/eliquis/home

## 2021-09-18 NOTE — ED Provider Notes (Signed)
[facility] EMERGENCY DEPARTMENT Provider Note   CSN: [id number] Arrival date & time: 09/18/21  0263     History {Add pertinent medical, surgical, social history, OB history to HPI:1} No chief complaint on file.   Wayne Alvarez is a 71 y.o. male.  HPI Patient presents for chest pain and shortness of breath.  Medical history includes HTN, HLD, chronic pain.  He states that he was in his normal state of health up until Saturday.  On Saturday, he developed what he describes as symptoms of reflux with a substernal burning sensation.  This resolved on its own.  It occurred again last night but also resolved spontaneously.  This morning, further symptoms of substernal chest burning and also experienced shortness of breath.  This prompted him to go to urgent care.  At urgent care, EKG was obtained which showed a narrow complex regular rhythm tachycardia with a heart rate of 198.  EMS was called.  EMS gave 324 of ASA, 20 mg of Cardizem, and 200 cc of IVF.  Following the dose of Cardizem, patient converted to a normal heart rate.  With this conversion, he reports resolution of symptoms.  Currently, patient denies any current symptoms.  He does describe previous episodes of rapid heart rate with similar symptoms that resolved on their own.  He has never seen a cardiologist.    Home Medications Prior to Admission medications   Medication Sig Start Date End Date Taking? Authorizing Provider  amLODipine (NORVASC) 2.5 MG tablet Take 2.5 mg by mouth daily.    [provider]  docusate sodium (COLACE) 100 MG capsule Take 1 capsule (100 mg total) by mouth 2 (two) times daily. 05/29/13   Lanae Crumbly, PA-C  methocarbamol (ROBAXIN) 500 MG tablet Take 1 tablet (500 mg total) by mouth every 6 (six) hours as needed for muscle spasms. 05/29/13   Lanae Crumbly, PA-C  ondansetron (ZOFRAN ODT) 4 MG disintegrating tablet Take 1 tablet (4 mg total) by mouth every 8 (eight) hours as needed for nausea or  vomiting. 05/29/13   Lanae Crumbly, PA-C  oxyCODONE-acetaminophen (PERCOCET/ROXICET) 5-325 MG per tablet Take 1-2 tablets by mouth every 6 (six) hours as needed for severe pain. 05/29/13   Lanae Crumbly, PA-C  polyethylene glycol [facility] Floria Raveling) packet Take 17 g by mouth daily. 05/29/13   Lanae Crumbly, PA-C      Allergies    Patient has no known allergies.    Review of Systems   Review of Systems  Respiratory:  Positive for shortness of breath.   Cardiovascular:  Positive for chest pain and palpitations.  All other systems reviewed and are negative.  Physical Exam Updated Vital Signs There were no vitals taken for this visit. Physical Exam Vitals and nursing note reviewed.  Constitutional:      General: He is not in acute distress.    Appearance: He is well-developed and normal weight. He is not ill-appearing, toxic-appearing or diaphoretic.  HENT:     Head: Normocephalic and atraumatic.     Mouth/Throat:     Mouth: Mucous membranes are moist.     Pharynx: Oropharynx is clear.  Eyes:     Extraocular Movements: Extraocular movements intact.     Conjunctiva/sclera: Conjunctivae normal.  Neck:     Vascular: No JVD.  Cardiovascular:     Rate and Rhythm: Normal rate. Rhythm irregular.     Heart sounds: No murmur heard. Pulmonary:     Effort: Pulmonary effort  is normal. No tachypnea, accessory muscle usage or respiratory distress.     Breath sounds: Normal breath sounds. No wheezing, rhonchi or rales.  Chest:     Chest wall: No tenderness.  Abdominal:     Palpations: Abdomen is soft.     Tenderness: There is no abdominal tenderness.  Musculoskeletal:        General: No swelling. Normal range of motion.     Cervical back: Normal range of motion and neck supple.     Right lower leg: No edema.     Left lower leg: No edema.  Skin:    General: Skin is warm and dry.     Capillary Refill: Capillary refill takes less than 2 seconds.  Neurological:     General: No focal  deficit present.     Mental Status: He is alert and oriented to person, place, and time.     Cranial Nerves: No cranial nerve deficit.     Motor: No weakness.  Psychiatric:        Mood and Affect: Mood normal.        Behavior: Behavior normal.    ED Results / Procedures / Treatments   Labs (all labs ordered are listed, but only abnormal results are displayed) Labs Reviewed - No data to display  EKG None  Radiology No results found.  Procedures Procedures  {Document cardiac monitor, telemetry assessment procedure when appropriate:1}  Medications Ordered in ED Medications - No data to display  ED Course/ Medical Decision Making/ A&P                           Medical Decision Making  ***  {Document critical care time when appropriate:1} {Document review of labs and clinical decision tools ie heart score, Chads2Vasc2 etc:1}  {Document your independent review of radiology images, and any outside records:1} {Document your discussion with family members, caretakers, and with consultants:1} {Document social determinants of health affecting pt's care:1} {Document your decision making why or why not admission, treatments were needed:1} Final Clinical Impression(s) / ED Diagnoses Final diagnoses:  None    Rx / DC Orders ED Discharge Orders     None

## 2021-11-08 DIAGNOSIS — Z6824 Body mass index (BMI) 24.0-24.9, adult: Secondary | ICD-10-CM | POA: Diagnosis not present

## 2021-11-08 DIAGNOSIS — Z0189 Encounter for other specified special examinations: Secondary | ICD-10-CM | POA: Diagnosis not present

## 2021-11-08 DIAGNOSIS — F17200 Nicotine dependence, unspecified, uncomplicated: Secondary | ICD-10-CM | POA: Diagnosis not present

## 2021-11-08 DIAGNOSIS — G252 Other specified forms of tremor: Secondary | ICD-10-CM | POA: Diagnosis not present

## 2021-11-08 DIAGNOSIS — E785 Hyperlipidemia, unspecified: Secondary | ICD-10-CM | POA: Diagnosis not present

## 2021-11-08 DIAGNOSIS — Z23 Encounter for immunization: Secondary | ICD-10-CM | POA: Diagnosis not present

## 2021-11-08 DIAGNOSIS — I4891 Unspecified atrial fibrillation: Secondary | ICD-10-CM | POA: Diagnosis not present

## 2021-11-08 DIAGNOSIS — Z125 Encounter for screening for malignant neoplasm of prostate: Secondary | ICD-10-CM | POA: Diagnosis not present

## 2021-11-08 DIAGNOSIS — Z7689 Persons encountering health services in other specified circumstances: Secondary | ICD-10-CM | POA: Diagnosis not present

## 2021-11-08 DIAGNOSIS — R7301 Impaired fasting glucose: Secondary | ICD-10-CM | POA: Diagnosis not present

## 2021-11-21 DIAGNOSIS — Z716 Tobacco abuse counseling: Secondary | ICD-10-CM | POA: Diagnosis not present

## 2021-11-21 DIAGNOSIS — Z0000 Encounter for general adult medical examination without abnormal findings: Secondary | ICD-10-CM | POA: Diagnosis not present

## 2021-11-21 DIAGNOSIS — Z125 Encounter for screening for malignant neoplasm of prostate: Secondary | ICD-10-CM | POA: Diagnosis not present

## 2021-11-21 DIAGNOSIS — Z0001 Encounter for general adult medical examination with abnormal findings: Secondary | ICD-10-CM | POA: Diagnosis not present

## 2021-11-21 DIAGNOSIS — F17200 Nicotine dependence, unspecified, uncomplicated: Secondary | ICD-10-CM | POA: Diagnosis not present

## 2021-11-21 DIAGNOSIS — E785 Hyperlipidemia, unspecified: Secondary | ICD-10-CM | POA: Diagnosis not present

## 2021-11-21 DIAGNOSIS — R7303 Prediabetes: Secondary | ICD-10-CM | POA: Diagnosis not present

## 2021-11-21 DIAGNOSIS — I4891 Unspecified atrial fibrillation: Secondary | ICD-10-CM | POA: Diagnosis not present

## 2021-11-21 DIAGNOSIS — G252 Other specified forms of tremor: Secondary | ICD-10-CM | POA: Diagnosis not present

## 2021-12-01 ENCOUNTER — Encounter: Payer: Self-pay | Admitting: Cardiology

## 2021-12-01 ENCOUNTER — Ambulatory Visit (INDEPENDENT_AMBULATORY_CARE_PROVIDER_SITE_OTHER): Payer: Medicare HMO | Admitting: Cardiology

## 2021-12-01 VITALS — BP 140/78 | HR 68 | Ht 71.0 in | Wt 172.4 lb

## 2021-12-01 DIAGNOSIS — I4892 Unspecified atrial flutter: Secondary | ICD-10-CM | POA: Diagnosis not present
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
## 2021-12-01 NOTE — Patient Instructions (Signed)
Medication Instructions:  Your physician recommends that you continue on your current medications as directed. Please refer to the Current Medication list given to you today.   Labwork: None  Testing/Procedures: Your physician has requested that you have an echocardiogram. Echocardiography is a painless test that uses sound waves to create images of your heart. It provides your doctor with information about the size and shape of your heart and how well your heart's chambers and valves are working. This procedure takes approximately one hour. There are no restrictions for this procedure.   Follow-Up: Follow up with Dr. Harl Bowie in 6 months.   Any Other Special Instructions Will Be Listed Below (If Applicable).     If you need a refill on your cardiac medications before your next appointment, please call your pharmacy.

## 2021-12-05 ENCOUNTER — Ambulatory Visit (HOSPITAL_COMMUNITY)
Admission: RE | Admit: 2021-12-05 | Discharge: 2021-12-05 | Disposition: A | Discharge status: Home / Self Care | Payer: Medicare HMO | Source: Ambulatory Visit | Attending: Cardiology | Admitting: Cardiology

## 2021-12-05 DIAGNOSIS — I4892 Unspecified atrial flutter: Secondary | ICD-10-CM | POA: Insufficient documentation

## 2021-12-05 LAB — ECHOCARDIOGRAM COMPLETE
Area-P 1/2: 3.6 cm2
S' Lateral: 3.4 cm

## 2021-12-05 NOTE — Progress Notes (Signed)
*  PRELIMINARY RESULTS* Echocardiogram 2D Echocardiogram has been performed.  Wayne Alvarez 12/05/2021, 1:41 PM

## 2021-12-21 ENCOUNTER — Telehealth: Payer: Self-pay

## 2021-12-21 NOTE — Telephone Encounter (Signed)
-----   Message from Laurine Blazer, LPN sent at 7/0/1100  5:51 PM EDT -----  ----- Message ----- From: Arnoldo Lenis, MD Sent: 12/19/2021  10:04 AM EDT To: Laurine Blazer, LPN  Echo looks good, overall normal heart findings   Zandra Abts MD

## 2021-12-21 NOTE — Telephone Encounter (Signed)
Patient notified and verbalized understanding. Patient had no questions or concerns at this time. PCP copied 

## 2022-06-19 ENCOUNTER — Ambulatory Visit: Payer: Medicare HMO | Attending: Cardiology | Admitting: Cardiology
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
## 2022-06-21 ENCOUNTER — Encounter: Payer: Self-pay | Admitting: Cardiology

## 2022-07-18 DIAGNOSIS — R7303 Prediabetes: Secondary | ICD-10-CM | POA: Diagnosis not present

## 2022-07-18 DIAGNOSIS — I4891 Unspecified atrial fibrillation: Secondary | ICD-10-CM | POA: Diagnosis not present

## 2022-07-26 ENCOUNTER — Encounter: Payer: Self-pay | Admitting: Internal Medicine

## 2022-07-26 ENCOUNTER — Other Ambulatory Visit (HOSPITAL_COMMUNITY): Payer: Self-pay | Admitting: Internal Medicine

## 2022-07-26 DIAGNOSIS — I4891 Unspecified atrial fibrillation: Secondary | ICD-10-CM | POA: Diagnosis not present

## 2022-07-26 DIAGNOSIS — F17210 Nicotine dependence, cigarettes, uncomplicated: Secondary | ICD-10-CM | POA: Diagnosis not present

## 2022-07-26 DIAGNOSIS — Z716 Tobacco abuse counseling: Secondary | ICD-10-CM | POA: Diagnosis not present

## 2022-07-26 DIAGNOSIS — I10 Essential (primary) hypertension: Secondary | ICD-10-CM | POA: Diagnosis not present

## 2022-07-26 DIAGNOSIS — E785 Hyperlipidemia, unspecified: Secondary | ICD-10-CM | POA: Diagnosis not present

## 2022-07-26 DIAGNOSIS — N1831 Chronic kidney disease, stage 3a: Secondary | ICD-10-CM | POA: Diagnosis not present

## 2022-07-26 DIAGNOSIS — G252 Other specified forms of tremor: Secondary | ICD-10-CM | POA: Diagnosis not present

## 2022-07-26 DIAGNOSIS — F17200 Nicotine dependence, unspecified, uncomplicated: Secondary | ICD-10-CM

## 2022-07-26 DIAGNOSIS — Z0000 Encounter for general adult medical examination without abnormal findings: Secondary | ICD-10-CM | POA: Diagnosis not present

## 2022-07-26 DIAGNOSIS — I129 Hypertensive chronic kidney disease with stage 1 through stage 4 chronic kidney disease, or unspecified chronic kidney disease: Secondary | ICD-10-CM | POA: Diagnosis not present

## 2022-08-09 DIAGNOSIS — Z713 Dietary counseling and surveillance: Secondary | ICD-10-CM | POA: Diagnosis not present

## 2022-08-09 DIAGNOSIS — F17200 Nicotine dependence, unspecified, uncomplicated: Secondary | ICD-10-CM | POA: Diagnosis not present

## 2022-08-09 DIAGNOSIS — I10 Essential (primary) hypertension: Secondary | ICD-10-CM | POA: Diagnosis not present

## 2022-08-09 DIAGNOSIS — Z79899 Other long term (current) drug therapy: Secondary | ICD-10-CM | POA: Diagnosis not present

## 2022-08-09 DIAGNOSIS — F17210 Nicotine dependence, cigarettes, uncomplicated: Secondary | ICD-10-CM | POA: Diagnosis not present

## 2022-08-09 DIAGNOSIS — J302 Other seasonal allergic rhinitis: Secondary | ICD-10-CM | POA: Diagnosis not present

## 2022-08-09 DIAGNOSIS — Z6824 Body mass index (BMI) 24.0-24.9, adult: Secondary | ICD-10-CM | POA: Diagnosis not present

## 2022-08-09 DIAGNOSIS — J019 Acute sinusitis, unspecified: Secondary | ICD-10-CM | POA: Diagnosis not present

## 2022-09-20 ENCOUNTER — Ambulatory Visit (HOSPITAL_COMMUNITY): Payer: Medicare HMO

## 2022-11-13 ENCOUNTER — Ambulatory Visit (HOSPITAL_COMMUNITY)
Admission: RE | Admit: 2022-11-13 | Discharge: 2022-11-13 | Disposition: A | Discharge status: Home / Self Care | Payer: Medicare HMO | Source: Ambulatory Visit | Attending: Internal Medicine | Admitting: Internal Medicine

## 2022-11-13 DIAGNOSIS — F17210 Nicotine dependence, cigarettes, uncomplicated: Secondary | ICD-10-CM | POA: Diagnosis not present

## 2022-11-13 DIAGNOSIS — I700 Atherosclerosis of aorta: Secondary | ICD-10-CM | POA: Diagnosis not present

## 2022-11-13 DIAGNOSIS — F17200 Nicotine dependence, unspecified, uncomplicated: Secondary | ICD-10-CM | POA: Insufficient documentation

## 2022-11-13 DIAGNOSIS — J439 Emphysema, unspecified: Secondary | ICD-10-CM | POA: Insufficient documentation

## 2022-11-13 DIAGNOSIS — Z122 Encounter for screening for malignant neoplasm of respiratory organs: Secondary | ICD-10-CM | POA: Insufficient documentation

## 2022-11-13 DIAGNOSIS — I2510 Atherosclerotic heart disease of native coronary artery without angina pectoris: Secondary | ICD-10-CM | POA: Insufficient documentation

## 2023-01-24 DIAGNOSIS — R7303 Prediabetes: Secondary | ICD-10-CM | POA: Diagnosis not present

## 2023-01-24 DIAGNOSIS — I10 Essential (primary) hypertension: Secondary | ICD-10-CM | POA: Diagnosis not present

## 2023-01-24 DIAGNOSIS — N1831 Chronic kidney disease, stage 3a: Secondary | ICD-10-CM | POA: Diagnosis not present

## 2023-01-24 DIAGNOSIS — G252 Other specified forms of tremor: Secondary | ICD-10-CM | POA: Diagnosis not present

## 2023-01-24 DIAGNOSIS — M1831 Unilateral post-traumatic osteoarthritis of first carpometacarpal joint, right hand: Secondary | ICD-10-CM | POA: Diagnosis not present

## 2023-01-24 DIAGNOSIS — E785 Hyperlipidemia, unspecified: Secondary | ICD-10-CM | POA: Diagnosis not present

## 2023-01-24 DIAGNOSIS — Z125 Encounter for screening for malignant neoplasm of prostate: Secondary | ICD-10-CM | POA: Diagnosis not present

## 2023-01-24 DIAGNOSIS — F17210 Nicotine dependence, cigarettes, uncomplicated: Secondary | ICD-10-CM | POA: Diagnosis not present

## 2023-01-24 DIAGNOSIS — I4891 Unspecified atrial fibrillation: Secondary | ICD-10-CM | POA: Diagnosis not present

## 2023-01-24 DIAGNOSIS — I129 Hypertensive chronic kidney disease with stage 1 through stage 4 chronic kidney disease, or unspecified chronic kidney disease: Secondary | ICD-10-CM | POA: Diagnosis not present

## 2023-05-21 ENCOUNTER — Telehealth: Payer: Self-pay

## 2023-05-21 NOTE — Telephone Encounter (Signed)
 Copied from CRM [id number]. Topic: Clinical - Prescription Issue >> May 21, 2023 11:31 AM Powell HERO wrote: Reason for CRM: Patient states they are having trouble receiving their incontinence supplies, they are wanting to find out if they can be connected with a more reliable company. Please call to advise.

## 2023-07-19 DIAGNOSIS — I10 Essential (primary) hypertension: Secondary | ICD-10-CM | POA: Diagnosis not present

## 2023-07-19 DIAGNOSIS — R7303 Prediabetes: Secondary | ICD-10-CM | POA: Diagnosis not present

## 2023-07-19 DIAGNOSIS — Z125 Encounter for screening for malignant neoplasm of prostate: Secondary | ICD-10-CM | POA: Diagnosis not present

## 2023-07-25 DIAGNOSIS — G252 Other specified forms of tremor: Secondary | ICD-10-CM | POA: Diagnosis not present

## 2023-07-25 DIAGNOSIS — F17210 Nicotine dependence, cigarettes, uncomplicated: Secondary | ICD-10-CM | POA: Diagnosis not present

## 2023-07-25 DIAGNOSIS — R7303 Prediabetes: Secondary | ICD-10-CM | POA: Diagnosis not present

## 2023-07-25 DIAGNOSIS — E785 Hyperlipidemia, unspecified: Secondary | ICD-10-CM | POA: Diagnosis not present

## 2023-07-25 DIAGNOSIS — I129 Hypertensive chronic kidney disease with stage 1 through stage 4 chronic kidney disease, or unspecified chronic kidney disease: Secondary | ICD-10-CM | POA: Diagnosis not present

## 2023-07-25 DIAGNOSIS — I4891 Unspecified atrial fibrillation: Secondary | ICD-10-CM | POA: Diagnosis not present

## 2023-07-25 DIAGNOSIS — Z1211 Encounter for screening for malignant neoplasm of colon: Secondary | ICD-10-CM | POA: Diagnosis not present

## 2023-07-25 DIAGNOSIS — I10 Essential (primary) hypertension: Secondary | ICD-10-CM | POA: Diagnosis not present

## 2023-07-25 DIAGNOSIS — N1831 Chronic kidney disease, stage 3a: Secondary | ICD-10-CM | POA: Diagnosis not present

## 2023-07-26 ENCOUNTER — Telehealth: Payer: Self-pay

## 2023-07-26 NOTE — Progress Notes (Signed)
   07/26/2023  Patient ID: Wayne Alvarez, male   DOB: [birth year], 73 y.o.   MRN: [id number]   Medication Access   Patient has not been taking Eliquis as prescribed due to cost. Unaware of plan changes for drug coverage this year. He has [employer] with a $250 deductible, explained that his first fill will cover his annual deductible and then monthly co-pays should go back to $47 per month with a $2000 OOP maximum. He said he could manage the deductible cost and agreed to pick Eliquis up at the pharmacy.   Fayette Pho, PharmD

## 2023-08-08 ENCOUNTER — Encounter: Payer: Self-pay | Admitting: *Deleted

## 2023-08-14 ENCOUNTER — Encounter: Payer: Self-pay | Admitting: Gastroenterology

## 2023-08-19 ENCOUNTER — Ambulatory Visit: Admitting: Gastroenterology

## 2023-08-19 ENCOUNTER — Telehealth: Payer: Self-pay

## 2023-08-19 NOTE — Telephone Encounter (Signed)
-----   Message from Azalee Lenz sent at 08/17/2023 11:36 AM EDT ----- This guy is new patient referred for screening colonoscopy. 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 Nothing in Epic or in referral forms or [facility] shows cultures or pulmonology visits etc.   Will you please call pcp's office and find out if the patient has active mycobacterium avium pulmonary infection. If so, we should reschedule. If not, he can be seen.
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
## 2023-09-06 ENCOUNTER — Ambulatory Visit: Admitting: Gastroenterology

## 2023-09-06 ENCOUNTER — Encounter: Payer: Self-pay | Admitting: Gastroenterology

## 2023-10-21 DIAGNOSIS — I10 Essential (primary) hypertension: Secondary | ICD-10-CM | POA: Diagnosis not present

## 2023-10-21 DIAGNOSIS — R7303 Prediabetes: Secondary | ICD-10-CM | POA: Diagnosis not present

## 2023-10-24 ENCOUNTER — Telehealth: Payer: Self-pay

## 2023-10-24 ENCOUNTER — Other Ambulatory Visit (HOSPITAL_COMMUNITY): Payer: Self-pay | Admitting: Nurse Practitioner

## 2023-10-24 DIAGNOSIS — E785 Hyperlipidemia, unspecified: Secondary | ICD-10-CM | POA: Diagnosis not present

## 2023-10-24 DIAGNOSIS — I4891 Unspecified atrial fibrillation: Secondary | ICD-10-CM | POA: Diagnosis not present

## 2023-10-24 DIAGNOSIS — I129 Hypertensive chronic kidney disease with stage 1 through stage 4 chronic kidney disease, or unspecified chronic kidney disease: Secondary | ICD-10-CM | POA: Diagnosis not present

## 2023-10-24 DIAGNOSIS — G252 Other specified forms of tremor: Secondary | ICD-10-CM | POA: Diagnosis not present

## 2023-10-24 DIAGNOSIS — R7303 Prediabetes: Secondary | ICD-10-CM | POA: Diagnosis not present

## 2023-10-24 DIAGNOSIS — Z122 Encounter for screening for malignant neoplasm of respiratory organs: Secondary | ICD-10-CM

## 2023-10-24 DIAGNOSIS — F17210 Nicotine dependence, cigarettes, uncomplicated: Secondary | ICD-10-CM | POA: Diagnosis not present

## 2023-10-24 DIAGNOSIS — I10 Essential (primary) hypertension: Secondary | ICD-10-CM | POA: Diagnosis not present

## 2023-10-24 DIAGNOSIS — N1831 Chronic kidney disease, stage 3a: Secondary | ICD-10-CM | POA: Diagnosis not present

## 2023-10-24 DIAGNOSIS — J439 Emphysema, unspecified: Secondary | ICD-10-CM | POA: Diagnosis not present

## 2023-10-25 ENCOUNTER — Telehealth: Payer: Self-pay | Admitting: *Deleted

## 2023-10-25 NOTE — Progress Notes (Unsigned)
 Complex Care Management Note Care Guide Note  10/25/2023 Name: Wayne Alvarez MRN: [id number] DOB: June 19, 1950   Complex Care Management Outreach Attempts: An unsuccessful telephone outreach was attempted today to offer the patient information about available complex care management services.  Follow Up Plan:  Additional outreach attempts will be made to offer the patient complex care management information and services.   Encounter Outcome:  No Answer  Thedford Franks, CMA [place]  [facility], [facility] Guide Direct Dial: [phone]  Fax: [phone] Website: [email or web address]

## 2023-10-25 NOTE — Transitions of Care (Post Inpatient/ED Visit) (Signed)
 10/25/2023  Patient ID: Wayne Alvarez, male   DOB: 08-03-1950, 73 y.o.   MRN: [id number]  Chart Review for transitions of care. No hospitalization noted.    Shahira Fiske J. Feliza Diven RN, MSN [facility], [facility] RN Care Manager Direct Dial: [phone]  Fax: [phone] Website: [email or web address]

## 2023-10-28 NOTE — Progress Notes (Unsigned)
 Complex Care Management Note Care Guide Note  10/28/2023 Name: TARAY NORMOYLE MRN: [id number] DOB: 1951/03/15   Complex Care Management Outreach Attempts: A second unsuccessful outreach was attempted today to offer the patient with information about available complex care management services.  Follow Up Plan:  Additional outreach attempts will be made to offer the patient complex care management information and services.   Encounter Outcome:  No Answer  Leotis Rase [facility], [facility] Guide  Direct Dial: [phone]  Fax [phone]

## 2023-10-29 NOTE — Progress Notes (Signed)
 Complex Care Management Note Care Guide Note  10/29/2023 Name: Wayne Alvarez MRN: [id number] DOB: 03/03/1951   Complex Care Management Outreach Attempts: A third unsuccessful outreach was attempted today to offer the patient with information about available complex care management services.  Follow Up Plan:  No further outreach attempts will be made at this time. We have been unable to contact the patient to offer or enroll patient in complex care management services.  Encounter Outcome:  No Answer  Leotis Rase [facility], [facility] Guide  Direct Dial: [phone]  Fax [phone]

## 2024-01-15 ENCOUNTER — Ambulatory Visit (HOSPITAL_COMMUNITY)
Admission: RE | Admit: 2024-01-15 | Discharge: 2024-01-15 | Disposition: A | Discharge status: Home / Self Care | Payer: Self-pay | Source: Ambulatory Visit | Attending: Nurse Practitioner | Admitting: Nurse Practitioner

## 2024-01-15 ENCOUNTER — Encounter (HOSPITAL_COMMUNITY): Payer: Self-pay | Admitting: Nurse Practitioner

## 2024-01-15 ENCOUNTER — Other Ambulatory Visit (HOSPITAL_COMMUNITY): Payer: Self-pay | Admitting: Nurse Practitioner

## 2024-01-15 DIAGNOSIS — M25561 Pain in right knee: Secondary | ICD-10-CM | POA: Insufficient documentation

## 2024-01-15 DIAGNOSIS — M25569 Pain in unspecified knee: Secondary | ICD-10-CM | POA: Diagnosis not present

## 2024-01-15 DIAGNOSIS — M7989 Other specified soft tissue disorders: Secondary | ICD-10-CM | POA: Diagnosis not present

## 2024-01-15 DIAGNOSIS — M1711 Unilateral primary osteoarthritis, right knee: Secondary | ICD-10-CM | POA: Diagnosis not present

## 2024-02-18 ENCOUNTER — Telehealth: Payer: Self-pay

## 2024-02-18 NOTE — Telephone Encounter (Signed)
 This RN provided [employer] with contact number for patient's cardiologist Alvan Dorn FALCON, MD   Copied from CRM [id number]. Topic: Clinical - Medical Advice >> Feb 18, 2024 10:22 AM Terri MATSU wrote: Reason for CRM: Jolene from [facility] is calling regarding patient, wanted to know if patient cardiovascular disease to remain legible in his plan. Callback number [phone] reference number [id number]

## 2024-05-12 ENCOUNTER — Encounter (HOSPITAL_COMMUNITY): Payer: Self-pay | Admitting: *Deleted

## 2024-05-12 ENCOUNTER — Inpatient Hospital Stay (HOSPITAL_COMMUNITY)
Admission: EM | Admit: 2024-05-12 | Discharge: 2024-05-16 | DRG: 308 | Disposition: A | Discharge status: Home / Self Care | Attending: Internal Medicine | Admitting: Internal Medicine

## 2024-05-12 ENCOUNTER — Other Ambulatory Visit: Payer: Self-pay

## 2024-05-12 ENCOUNTER — Emergency Department (HOSPITAL_COMMUNITY)

## 2024-05-12 DIAGNOSIS — N179 Acute kidney failure, unspecified: Secondary | ICD-10-CM | POA: Diagnosis present

## 2024-05-12 DIAGNOSIS — I4892 Unspecified atrial flutter: Principal | ICD-10-CM | POA: Diagnosis present

## 2024-05-12 DIAGNOSIS — Z7901 Long term (current) use of anticoagulants: Secondary | ICD-10-CM

## 2024-05-12 DIAGNOSIS — Z79899 Other long term (current) drug therapy: Secondary | ICD-10-CM

## 2024-05-12 DIAGNOSIS — I480 Paroxysmal atrial fibrillation: Secondary | ICD-10-CM | POA: Diagnosis present

## 2024-05-12 DIAGNOSIS — I5041 Acute combined systolic (congestive) and diastolic (congestive) heart failure: Secondary | ICD-10-CM

## 2024-05-12 DIAGNOSIS — Z981 Arthrodesis status: Secondary | ICD-10-CM

## 2024-05-12 DIAGNOSIS — E875 Hyperkalemia: Secondary | ICD-10-CM | POA: Diagnosis present

## 2024-05-12 DIAGNOSIS — I4719 Other supraventricular tachycardia: Secondary | ICD-10-CM | POA: Diagnosis present

## 2024-05-12 DIAGNOSIS — R9431 Abnormal electrocardiogram [ECG] [EKG]: Secondary | ICD-10-CM

## 2024-05-12 DIAGNOSIS — K219 Gastro-esophageal reflux disease without esophagitis: Secondary | ICD-10-CM | POA: Diagnosis present

## 2024-05-12 DIAGNOSIS — R000 Tachycardia, unspecified: Principal | ICD-10-CM | POA: Insufficient documentation

## 2024-05-12 DIAGNOSIS — I4510 Unspecified right bundle-branch block: Secondary | ICD-10-CM | POA: Diagnosis present

## 2024-05-12 DIAGNOSIS — I5189 Other ill-defined heart diseases: Secondary | ICD-10-CM

## 2024-05-12 DIAGNOSIS — J441 Chronic obstructive pulmonary disease with (acute) exacerbation: Secondary | ICD-10-CM | POA: Diagnosis present

## 2024-05-12 DIAGNOSIS — F17210 Nicotine dependence, cigarettes, uncomplicated: Secondary | ICD-10-CM | POA: Diagnosis present

## 2024-05-12 DIAGNOSIS — J9601 Acute respiratory failure with hypoxia: Secondary | ICD-10-CM | POA: Diagnosis present

## 2024-05-12 DIAGNOSIS — I110 Hypertensive heart disease with heart failure: Secondary | ICD-10-CM | POA: Diagnosis present

## 2024-05-12 DIAGNOSIS — E876 Hypokalemia: Secondary | ICD-10-CM | POA: Diagnosis present

## 2024-05-12 HISTORY — DX: Unspecified atrial fibrillation: I48.91

## 2024-05-12 LAB — MAGNESIUM: Magnesium: 2.1 mg/dL (ref 1.7–2.4)

## 2024-05-12 LAB — BASIC METABOLIC PANEL WITH GFR
Anion gap: 15 (ref 5–15)
BUN: 23 mg/dL (ref 8–23)
CO2: 20 mmol/L — ABNORMAL LOW (ref 22–32)
Calcium: 8.5 mg/dL — ABNORMAL LOW (ref 8.9–10.3)
Chloride: 104 mmol/L (ref 98–111)
Creatinine, Ser: 1.68 mg/dL — ABNORMAL HIGH (ref 0.61–1.24)
GFR, Estimated: 43 mL/min — ABNORMAL LOW
Glucose, Bld: 137 mg/dL — ABNORMAL HIGH (ref 70–99)
Potassium: 4.1 mmol/L (ref 3.5–5.1)
Sodium: 139 mmol/L (ref 135–145)

## 2024-05-12 LAB — CBC
HCT: 47.6 % (ref 39.0–52.0)
Hemoglobin: 15.9 g/dL (ref 13.0–17.0)
MCH: 31.4 pg (ref 26.0–34.0)
MCHC: 33.4 g/dL (ref 30.0–36.0)
MCV: 94.1 fL (ref 80.0–100.0)
Platelets: 191 10*3/uL (ref 150–400)
RBC: 5.06 MIL/uL (ref 4.22–5.81)
RDW: 15.3 % (ref 11.5–15.5)
WBC: 9.1 10*3/uL (ref 4.0–10.5)
nRBC: 0 % (ref 0.0–0.2)

## 2024-05-12 LAB — GLUCOSE, CAPILLARY: Glucose-Capillary: 105 mg/dL — ABNORMAL HIGH (ref 70–99)

## 2024-05-12 LAB — TSH: TSH: 1.52 u[IU]/mL (ref 0.350–4.500)

## 2024-05-12 MED ORDER — PROPOFOL 10 MG/ML IV BOLUS
0.5000 mg/kg | Freq: Once | INTRAVENOUS | Status: AC
  Administered 2024-05-12: 38.6 mg via INTRAVENOUS

## 2024-05-12 MED ORDER — SODIUM CHLORIDE 0.9 % IV BOLUS
1000.0000 mL | Freq: Once | INTRAVENOUS | Status: AC
  Administered 2024-05-12: 1000 mL via INTRAVENOUS

## 2024-05-12 MED ORDER — ONDANSETRON HCL 4 MG/2ML IJ SOLN
4.0000 mg | Freq: Four times a day (QID) | INTRAMUSCULAR | Status: DC | PRN
  Administered 2024-05-14: 4 mg via INTRAVENOUS
  Filled 2024-05-12: qty 2

## 2024-05-12 MED ORDER — APIXABAN 5 MG PO TABS
5.0000 mg | ORAL_TABLET | Freq: Two times a day (BID) | ORAL | Status: DC
  Administered 2024-05-12 – 2024-05-16 (×8): 5 mg via ORAL
  Filled 2024-05-12 (×8): qty 1

## 2024-05-12 MED ORDER — ONDANSETRON HCL 4 MG PO TABS: 4.0000 mg | ORAL_TABLET | Freq: Four times a day (QID) | ORAL | Status: DC | PRN

## 2024-05-12 MED ORDER — ACETAMINOPHEN 325 MG PO TABS: 650.0000 mg | ORAL_TABLET | Freq: Four times a day (QID) | ORAL | Status: DC | PRN

## 2024-05-12 MED ORDER — AMIODARONE HCL IN DEXTROSE 360-4.14 MG/200ML-% IV SOLN
30.0000 mg/h | INTRAVENOUS | Status: AC
  Administered 2024-05-13 – 2024-05-15 (×5): 30 mg/h via INTRAVENOUS
  Filled 2024-05-12 (×7): qty 200

## 2024-05-12 MED ORDER — CHLORHEXIDINE GLUCONATE CLOTH 2 % EX PADS
6.0000 | MEDICATED_PAD | Freq: Every day | CUTANEOUS | Status: DC
  Administered 2024-05-12 – 2024-05-13 (×2): 6 via TOPICAL

## 2024-05-12 MED ORDER — LACTATED RINGERS IV SOLN: INTRAVENOUS | Status: AC

## 2024-05-12 MED ORDER — AMIODARONE HCL IN DEXTROSE 360-4.14 MG/200ML-% IV SOLN
60.0000 mg/h | INTRAVENOUS | Status: AC
  Administered 2024-05-12 (×2): 60 mg/h via INTRAVENOUS
  Filled 2024-05-12 (×2): qty 200

## 2024-05-12 MED ORDER — AMIODARONE LOAD VIA INFUSION
150.0000 mg | Freq: Once | INTRAVENOUS | Status: AC
  Administered 2024-05-12: 150 mg via INTRAVENOUS
  Filled 2024-05-12: qty 83.34

## 2024-05-12 MED ORDER — ACETAMINOPHEN 650 MG RE SUPP: 650.0000 mg | Freq: Four times a day (QID) | RECTAL | Status: DC | PRN
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
## 2024-05-12 NOTE — ED Provider Notes (Signed)
 " [place] EMERGENCY DEPARTMENT AT [facility] Provider Note   CSN: [id number] Arrival date & time: 05/12/24  1510     Patient presents with: Tachycardia   Wayne Alvarez is a 74 y.o. male.  Presenting with 6 out of 10 chest pain and feeling his heart racing since yesterday.  History of a flutter.  On Eliquis  but said he has missed some doses.  Denies fevers chills nausea vomiting.  No syncope.  No drug use.  {Add pertinent medical, surgical, social history, OB history to YEP:67052} The history is provided by the patient.  Chest Pain Pain location:  Substernal area Pain quality: aching   Pain radiates to:  L jaw Pain severity:  Moderate Onset quality:  Gradual Duration:  2 days Timing:  Constant Progression:  Unchanged Chronicity:  New Relieved by:  Nothing Associated symptoms: palpitations   Associated symptoms: no nausea, no shortness of breath, no syncope and no vomiting   Risk factors: smoking        Prior to Admission medications  Medication Sig Start Date End Date Taking? Authorizing Provider  apixaban  (ELIQUIS ) 5 MG TABS tablet Take 1 tablet (5 mg total) by mouth 2 (two) times daily. 09/18/21 12/17/21  Melvenia Motto, MD  metoprolol  succinate (TOPROL -XL) 25 MG 24 hr tablet Take 1 tablet (25 mg total) by mouth daily. 09/18/21 12/17/21  Melvenia Motto, MD  Omega-3 Fatty Acids (FISH OIL) 1000 MG CAPS Take 1 capsule by mouth daily.    [provider]  primidone (MYSOLINE) 50 MG tablet Take 25 mg by mouth at bedtime. 11/08/21   [provider]    Allergies: Patient has no known allergies.    Review of Systems  Respiratory:  Negative for shortness of breath.   Cardiovascular:  Positive for chest pain and palpitations. Negative for syncope.  Gastrointestinal:  Negative for nausea and vomiting.    Updated Vital Signs BP 113/72 (BP Location: Right Arm)   Pulse (!) 125   Temp 97.6 F (36.4 C)   Resp 18   Ht 5' 11 (1.803 m)   Wt 77.1 kg   SpO2 99%    BMI 23.71 kg/m   Physical Exam Vitals and nursing note reviewed.  Constitutional:      General: He is not in acute distress.    Appearance: He is well-developed. He is diaphoretic.  HENT:     Head: Normocephalic and atraumatic.  Eyes:     Conjunctiva/sclera: Conjunctivae normal.  Cardiovascular:     Rate and Rhythm: Normal rate and regular rhythm.     Heart sounds: No murmur heard. Pulmonary:     Effort: Pulmonary effort is normal. No respiratory distress.     Breath sounds: Normal breath sounds.  Abdominal:     Palpations: Abdomen is soft.     Tenderness: There is no abdominal tenderness.  Musculoskeletal:     Cervical back: Neck supple.     Right lower leg: No edema.     Left lower leg: No edema.  Skin:    General: Skin is warm.     Capillary Refill: Capillary refill takes less than 2 seconds.  Neurological:     General: No focal deficit present.     Mental Status: He is alert.     Gait: Gait normal.     (all labs ordered are listed, but only abnormal results are displayed) Labs Reviewed  CBC  BASIC METABOLIC PANEL WITH GFR  MAGNESIUM  TSH    EKG:  None  Radiology: No results found.  {Document cardiac monitor, telemetry assessment procedure when appropriate:32947} Procedures   Medications Ordered in the ED  propofol  (DIPRIVAN ) 10 mg/mL bolus/IV push 38.6 mg (has no administration in time range)      {Click here for ABCD2, HEART and other calculators REFRESH Note before signing:1}                              Medical Decision Making Amount and/or Complexity of Data Reviewed Labs: ordered.   This patient complains of ***; this involves an extensive number of treatment Options and is a complaint that carries with it a high risk of complications and morbidity. The differential includes ***  I ordered, reviewed and interpreted labs, which included *** I ordered medication *** and reviewed PMP when indicated. I ordered imaging studies which included  *** and I independently    visualized and interpreted imaging which showed *** Additional history obtained from *** Previous records obtained and reviewed *** I consulted *** and discussed lab and imaging findings and discussed disposition.  Cardiac monitoring reviewed, *** Social determinants considered, *** Critical Interventions: ***  After the interventions stated above, I reevaluated the patient and found *** Admission and further testing considered, ***   {Document critical care time when appropriate  Document review of labs and clinical decision tools ie CHADS2VASC2, etc  Document your independent review of radiology images and any outside records  Document your discussion with family members, caretakers and with consultants  Document social determinants of health affecting pt's care  Document your decision making why or why not admission, treatments were needed:32947:::1}   Final diagnoses:  None    ED Discharge Orders     None        "

## 2024-05-12 NOTE — ED Triage Notes (Signed)
 Pt with feeling like heart racing since yesterday. Also feels like throat is closing. Pt states chest pressure going into my throat, denies difficultly swallowing or breathing. Airway patent.  Hx of Afib

## 2024-05-12 NOTE — ED Notes (Signed)
 Pt is awake at this time, pt stated he will like some more of that good sleeping juice, he thought he died and came back. HR 83, O2 96 RA, BP 85/64 at this time. Fluid bolus running NS.

## 2024-05-12 NOTE — ED Notes (Signed)
 Emergency override, Propofol  pulled for HR 258, Cardioversion given with HR 96. Monitoring pt at this time.

## 2024-05-13 ENCOUNTER — Observation Stay (HOSPITAL_COMMUNITY)

## 2024-05-13 DIAGNOSIS — Z79899 Other long term (current) drug therapy: Secondary | ICD-10-CM | POA: Diagnosis not present

## 2024-05-13 DIAGNOSIS — I5021 Acute systolic (congestive) heart failure: Secondary | ICD-10-CM | POA: Diagnosis not present

## 2024-05-13 DIAGNOSIS — E875 Hyperkalemia: Secondary | ICD-10-CM | POA: Diagnosis present

## 2024-05-13 DIAGNOSIS — I110 Hypertensive heart disease with heart failure: Secondary | ICD-10-CM | POA: Diagnosis present

## 2024-05-13 DIAGNOSIS — R000 Tachycardia, unspecified: Secondary | ICD-10-CM

## 2024-05-13 DIAGNOSIS — J810 Acute pulmonary edema: Secondary | ICD-10-CM | POA: Diagnosis not present

## 2024-05-13 DIAGNOSIS — I10 Essential (primary) hypertension: Secondary | ICD-10-CM | POA: Diagnosis not present

## 2024-05-13 DIAGNOSIS — J9601 Acute respiratory failure with hypoxia: Secondary | ICD-10-CM | POA: Diagnosis present

## 2024-05-13 DIAGNOSIS — F17210 Nicotine dependence, cigarettes, uncomplicated: Secondary | ICD-10-CM | POA: Diagnosis present

## 2024-05-13 DIAGNOSIS — I5041 Acute combined systolic (congestive) and diastolic (congestive) heart failure: Secondary | ICD-10-CM | POA: Diagnosis not present

## 2024-05-13 DIAGNOSIS — Z7901 Long term (current) use of anticoagulants: Secondary | ICD-10-CM | POA: Diagnosis not present

## 2024-05-13 DIAGNOSIS — I4719 Other supraventricular tachycardia: Secondary | ICD-10-CM | POA: Diagnosis present

## 2024-05-13 DIAGNOSIS — I4892 Unspecified atrial flutter: Secondary | ICD-10-CM

## 2024-05-13 DIAGNOSIS — N179 Acute kidney failure, unspecified: Secondary | ICD-10-CM | POA: Diagnosis present

## 2024-05-13 DIAGNOSIS — I4510 Unspecified right bundle-branch block: Secondary | ICD-10-CM | POA: Diagnosis present

## 2024-05-13 DIAGNOSIS — E876 Hypokalemia: Secondary | ICD-10-CM | POA: Diagnosis present

## 2024-05-13 DIAGNOSIS — I480 Paroxysmal atrial fibrillation: Secondary | ICD-10-CM | POA: Diagnosis present

## 2024-05-13 DIAGNOSIS — K219 Gastro-esophageal reflux disease without esophagitis: Secondary | ICD-10-CM

## 2024-05-13 DIAGNOSIS — J441 Chronic obstructive pulmonary disease with (acute) exacerbation: Secondary | ICD-10-CM | POA: Diagnosis present

## 2024-05-13 DIAGNOSIS — Z981 Arthrodesis status: Secondary | ICD-10-CM | POA: Diagnosis not present

## 2024-05-13 LAB — COMPREHENSIVE METABOLIC PANEL WITH GFR
ALT: 39 U/L (ref 0–44)
AST: 41 U/L (ref 15–41)
Albumin: 3.9 g/dL (ref 3.5–5.0)
Alkaline Phosphatase: 59 U/L (ref 38–126)
Anion gap: 11 (ref 5–15)
BUN: 23 mg/dL (ref 8–23)
CO2: 20 mmol/L — ABNORMAL LOW (ref 22–32)
Calcium: 7.9 mg/dL — ABNORMAL LOW (ref 8.9–10.3)
Chloride: 108 mmol/L (ref 98–111)
Creatinine, Ser: 1.33 mg/dL — ABNORMAL HIGH (ref 0.61–1.24)
GFR, Estimated: 56 mL/min — ABNORMAL LOW
Glucose, Bld: 116 mg/dL — ABNORMAL HIGH (ref 70–99)
Potassium: 3.7 mmol/L (ref 3.5–5.1)
Sodium: 139 mmol/L (ref 135–145)
Total Bilirubin: 0.3 mg/dL (ref 0.0–1.2)
Total Protein: 5.9 g/dL — ABNORMAL LOW (ref 6.5–8.1)

## 2024-05-13 LAB — ECHOCARDIOGRAM COMPLETE
AR max vel: 2.4 cm2
AV Area VTI: 2.51 cm2
AV Area mean vel: 1.99 cm2
AV Mean grad: 1 mmHg
AV Peak grad: 1.6 mmHg
Ao pk vel: 0.63 m/s
Area-P 1/2: 4.49 cm2
Height: 71 in
S' Lateral: 4.6 cm
Weight: 2832.47 [oz_av]

## 2024-05-13 LAB — RESPIRATORY PANEL BY PCR

## 2024-05-13 LAB — CBC
HCT: 41.9 % (ref 39.0–52.0)
Hemoglobin: 14 g/dL (ref 13.0–17.0)
MCH: 31 pg (ref 26.0–34.0)
MCHC: 33.4 g/dL (ref 30.0–36.0)
MCV: 92.9 fL (ref 80.0–100.0)
Platelets: 203 10*3/uL (ref 150–400)
RBC: 4.51 MIL/uL (ref 4.22–5.81)
RDW: 15.6 % — ABNORMAL HIGH (ref 11.5–15.5)
WBC: 8.4 10*3/uL (ref 4.0–10.5)
nRBC: 0 % (ref 0.0–0.2)

## 2024-05-13 LAB — PHOSPHORUS: Phosphorus: 3 mg/dL (ref 2.5–4.6)

## 2024-05-13 LAB — MRSA NEXT GEN BY PCR, NASAL: MRSA by PCR Next Gen: NOT DETECTED

## 2024-05-13 LAB — MAGNESIUM: Magnesium: 2.1 mg/dL (ref 1.7–2.4)

## 2024-05-13 LAB — PRO BRAIN NATRIURETIC PEPTIDE: Pro Brain Natriuretic Peptide: 1799 pg/mL — ABNORMAL HIGH

## 2024-05-13 MED ORDER — GUAIFENESIN-DM 100-10 MG/5ML PO SYRP
5.0000 mL | ORAL_SOLUTION | ORAL | Status: DC | PRN
  Administered 2024-05-13 (×2): 5 mL via ORAL
  Filled 2024-05-13 (×2): qty 5

## 2024-05-13 MED ORDER — PANTOPRAZOLE SODIUM 40 MG PO TBEC
40.0000 mg | DELAYED_RELEASE_TABLET | Freq: Every day | ORAL | Status: DC
  Administered 2024-05-13 – 2024-05-16 (×4): 40 mg via ORAL
  Filled 2024-05-13 (×4): qty 1

## 2024-05-13 MED ORDER — PERFLUTREN LIPID MICROSPHERE
1.0000 mL | INTRAVENOUS | Status: AC | PRN
  Administered 2024-05-13: 2 mL via INTRAVENOUS

## 2024-05-13 MED ORDER — DM-GUAIFENESIN ER 30-600 MG PO TB12
1.0000 | ORAL_TABLET | Freq: Two times a day (BID) | ORAL | Status: DC
  Administered 2024-05-13 – 2024-05-16 (×6): 1 via ORAL
  Filled 2024-05-13 (×6): qty 1

## 2024-05-13 MED ORDER — POTASSIUM CHLORIDE CRYS ER 20 MEQ PO TBCR
40.0000 meq | EXTENDED_RELEASE_TABLET | Freq: Once | ORAL | Status: AC
  Administered 2024-05-13: 40 meq via ORAL
  Filled 2024-05-13: qty 2

## 2024-05-13 MED ORDER — AMIODARONE LOAD VIA INFUSION
150.0000 mg | Freq: Once | INTRAVENOUS | Status: AC
  Administered 2024-05-13: 150 mg via INTRAVENOUS
  Filled 2024-05-13: qty 83.34

## 2024-05-13 MED ORDER — LORAZEPAM 2 MG/ML IJ SOLN: 0.5000 mg | Freq: Three times a day (TID) | INTRAMUSCULAR | Status: DC | PRN

## 2024-05-13 MED ORDER — FUROSEMIDE 10 MG/ML IJ SOLN
INTRAMUSCULAR | Status: AC
  Filled 2024-05-13: qty 8

## 2024-05-13 MED ORDER — FUROSEMIDE 10 MG/ML IJ SOLN
80.0000 mg | Freq: Once | INTRAMUSCULAR | Status: AC
  Administered 2024-05-13: 80 mg via INTRAVENOUS

## 2024-05-13 MED ORDER — FUROSEMIDE 10 MG/ML IJ SOLN
80.0000 mg | Freq: Once | INTRAMUSCULAR | Status: AC
  Administered 2024-05-13: 80 mg via INTRAVENOUS
  Filled 2024-05-13: qty 8

## 2024-05-13 MED ORDER — METOPROLOL TARTRATE 25 MG PO TABS
25.0000 mg | ORAL_TABLET | Freq: Four times a day (QID) | ORAL | Status: DC
  Administered 2024-05-13: 25 mg via ORAL
  Filled 2024-05-13 (×2): qty 1

## 2024-05-13 MED ORDER — FUROSEMIDE 10 MG/ML IJ SOLN: 40.0000 mg | Freq: Two times a day (BID) | INTRAMUSCULAR | Status: DC

## 2024-05-13 MED ORDER — PNEUMOCOCCAL 20-VAL CONJ VACC 0.5 ML IM SUSY
0.5000 mL | PREFILLED_SYRINGE | INTRAMUSCULAR | Status: DC
  Filled 2024-05-13: qty 0.5

## 2024-05-13 MED ORDER — METHYLPREDNISOLONE SODIUM SUCC 40 MG IJ SOLR
40.0000 mg | Freq: Two times a day (BID) | INTRAMUSCULAR | Status: DC
  Administered 2024-05-13 – 2024-05-15 (×4): 40 mg via INTRAVENOUS
  Filled 2024-05-13 (×4): qty 1

## 2024-05-13 NOTE — Progress Notes (Signed)
 [facility] called to set up transport. Waiting on truck. Nurse-to-nurse report called to [facility] 6E.

## 2024-05-13 NOTE — Progress Notes (Signed)
 " PROGRESS NOTE    Wayne Alvarez  FMW:[id number] DOB: Aug 03, 1950 DOA: 05/12/2024  PCP: Shona Norleen PEDLAR, MD    Chief Complaint  Patient presents with   Tachycardia    Brief Narrative:  As per H&P written by Dr. Manfred on 05/12/2024 Wayne Alvarez is a 74 y.o. male with medical history significant of A-fib/flutter on Eliquis  who presents to the emergency department due to sensation of heart racing yesterday while doing some cleaning at home, patient states that he went to take a rest after this and the heart racing subsided.  Today, while at rest, he had a recurrence of the heart racing without chest pain, so patient activated EMS and was taken to the ED for further evaluation.  Patient states that he has missed some doses of Eliquis  with last dose being on Tuesday (1/20).  He denies chest pain, nausea, vomiting, fever, chills.   ED course In the emergency department, he was tachypneic and tachycardic.  Workup in the ED showed normal CBC and BMP except for creatinine of 1.68.  TSH was 1.520.  Chest x-ray showed cardiomegaly with no acute superimposed cardiopulmonary process EKG done personally reviewed showed wide QRS tachycardia at a rate of 252 bpm, cardiology (Dr. Debera) was consulted and recommended cardioversion per EDP.  Patient was shocked once and went back to sinus rhythm with APCs, but this was only short-lived since patient went into SVT at a rate of 160 bpm with QTc of 509 ms.  He was then started on amiodarone  drip.  TRH was asked to admit patient.  Assessment & Plan: 1-paroxysmal atrial fibrillation with RVR - Status post cardioversion initially done in the ED; patient failed to keep control and ended experiencing back arrhythmia. - Patient has now been started on amiodarone  and metoprolol  - Patient will be transferred to [facility] for evaluation by EP - Continue Eliquis  for secondary prevention.  2-acute respiratory failure with hypoxia in the setting of acute  systolic heart failure and acute interstitial edema - 2D echo demonstrating ejection fraction 20 to 25% - Patient with prior history of smoking and no true diagnosis of COPD - Started on Solu-Medrol  and IV Lasix  - As needed BiPAP has been started to assist with acute interstitial edema process. - Continue rate control - Cardiology service on board and will follow recommendations for GDMT and follow the needed workup.SABRA  3-GERD/GI prophylaxis - Continue PPI.  4-hypokalemia - Status post repletion - Continue to follow electrolytes trend - Goal is for potassium above 4 and magnesium above 2.   DVT prophylaxis: Eliquis  Code Status: Full code Family Communication: No family at bedside. Disposition:   Status is: Observation The patient will require care spanning > 2 midnights and should be moved to inpatient because: Continue IV therapy.   Consultants:  Cardiology service  Procedures:  2D echo: Ejection fraction 20-25%, global hypokinesis and indeterminate diastolic function parameters.  No significant valvular disorder appreciated. - See below for x-ray reports.   Subjective: Afebrile, reporting no chest pain.  Patient reports increased shortness of breath, tachypnea, mildly productive coughing spells and having orthopnea.  Objective: Vitals:   05/13/24 0400 05/13/24 0500 05/13/24 0600 05/13/24 0753  BP: 112/84 (!) 132/95 119/80   Pulse: 77 (!) 141 74   Resp: (!) 23 (!) 21 (!) 24   Temp: 98 F (36.7 C)   (!) 97.5 F (36.4 C)  TempSrc: Oral   Oral  SpO2: 95% 94% 95%   Weight:  Height:        Intake/Output Summary (Last 24 hours) at 05/13/2024 1048 Last data filed at 05/13/2024 0455 Gross per 24 hour  Intake 4034.76 ml  Output 200 ml  Net 3834.76 ml   Filed Weights   05/12/24 1523 05/12/24 2200  Weight: 77.1 kg 80.3 kg    Examination:  General exam: Appears in mild to moderate distress secondary to shortness of breath. Respiratory system: Positive  expiratory wheezing and bilateral rails; tachypnea appreciated on exam. Cardiovascular system: Irregular, no rubs, no gallops.  No murmur appreciated on exam. Gastrointestinal system: Abdomen is nondistended, soft and nontender. No organomegaly or masses felt. Normal bowel sounds heard. Central nervous system: No focal neurological deficits. Extremities: Symmetric 5 x 5 power. Skin: No petechiae. Psychiatry: Judgement and insight appear normal.  Anxious    Data Reviewed: I have personally reviewed following labs and imaging studies  CBC: Recent Labs  Lab 05/12/24 1537 05/13/24 0456  WBC 9.1 8.4  HGB 15.9 14.0  HCT 47.6 41.9  MCV 94.1 92.9  PLT 191 203    Basic Metabolic Panel: Recent Labs  Lab 05/12/24 1537 05/13/24 0456  NA 139 139  K 4.1 3.7  CL 104 108  CO2 20* 20*  GLUCOSE 137* 116*  BUN 23 23  CREATININE 1.68* 1.33*  CALCIUM 8.5* 7.9*  MG 2.1 2.1  PHOS  --  3.0    GFR: Estimated Creatinine Clearance: 52.7 mL/min (A) (by C-G formula based on SCr of 1.33 mg/dL (H)).  Liver Function Tests: Recent Labs  Lab 05/13/24 0456  AST 41  ALT 39  ALKPHOS 59  BILITOT 0.3  PROT 5.9*  ALBUMIN 3.9    CBG: Recent Labs  Lab 05/12/24 2203  GLUCAP 105*     Recent Results (from the past 240 hours)  MRSA Next Gen by PCR, Nasal     Status: None   Collection Time: 05/12/24 10:05 PM   Specimen: Nasal Mucosa; Nasal Swab  Result Value Ref Range Status   MRSA by PCR Next Gen NOT DETECTED NOT DETECTED Final    Comment: (NOTE) The GeneXpert MRSA Assay (FDA approved for NASAL specimens only), is one component of a comprehensive MRSA colonization surveillance program. It is not intended to diagnose MRSA infection nor to guide or monitor treatment for MRSA infections. Test performance is not FDA approved in patients less than 79 years old. Performed at [facility], [address], [place], [place] [address]          Radiology Studies: [facility] Chest [facility] 1  View Result Date: 05/12/2024 CLINICAL DATA:  cp Per triage Pt with feeling like heart racing since yesterday. Also feels like throat is closing. Pt states chest pressure EXAM: PORTABLE CHEST 1 VIEW COMPARISON:  CHEST CALF, 09/18/2021.  CT CHEST, 11/13/2022. FINDINGS: Mild burden of aortic arch atherosclerosis. Cardiomegaly. Lungs are well inflated. Retrocardiac opacity. No additional focal consolidation. No pleural effusion or pneumothorax. Overlying cutaneous pacers. No acute displaced fracture. IMPRESSION: Cardiomegaly.  No acute superimposed cardiopulmonary process. Electronically Signed   By: Thom Hall M.D.   On: 05/12/2024 16:34        Scheduled Meds:  furosemide        apixaban   5 mg Oral BID   Chlorhexidine  Gluconate Cloth  6 each Topical Daily   dextromethorphan -guaiFENesin   1 tablet Oral BID   furosemide   40 mg Intravenous Q12H   methylPREDNISolone  (SOLU-MEDROL ) injection  40 mg Intravenous Q12H   metoprolol  tartrate  25 mg Oral QID   [  START ON 05/14/2024] pneumococcal 20-valent conjugate vaccine  0.5 mL Intramuscular Tomorrow-1000   Continuous Infusions:  amiodarone  30 mg/hr (05/13/24 0654)     LOS: 0 days    CRITICAL CARE Performed by: Eric Nunnery   Total critical care time: 55 minutes  Critical care time was exclusive of separately billable procedures and treating other patients.  Critical care was necessary to treat or prevent imminent or life-threatening deterioration.  Critical care was time spent personally by me on the following activities: development of treatment plan with patient and/or surrogate as well as nursing, discussions with consultants, evaluation of patient's response to treatment, examination of patient, obtaining history from patient or surrogate, ordering and performing treatments and interventions, ordering and review of laboratory studies, ordering and review of radiographic studies, pulse oximetry and re-evaluation of patient's  condition.     Eric Nunnery, MD [facility]   To contact the attending provider between 7A-7P or the covering provider during after hours 7P-7A, please log into the web site [email or web address] and access using universal [place] password for that web site. If you do not have the password, please call the hospital operator.  05/13/2024, 10:48 AM    "
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
## 2024-05-13 NOTE — Plan of Care (Signed)
" °  Problem: Education: Goal: Knowledge of General Education information will improve Description: Including pain rating scale, medication(s)/side effects and non-pharmacologic comfort measures Outcome: Progressing   Problem: Health Behavior/Discharge Planning: Goal: Ability to manage health-related needs will improve Outcome: Progressing   Problem: Clinical Measurements: Goal: Respiratory complications will improve Outcome: Progressing Goal: Cardiovascular complication will be avoided Outcome: Not Progressing   "

## 2024-05-13 NOTE — Progress Notes (Signed)
 Patient requested BIPAP for the night. Placed patient on bipap for the night with oxygen set at 3lpm

## 2024-05-13 NOTE — Progress Notes (Signed)
 Patient still going in and out of SVT with HR as high as 150s. Will convert back to NSR with no intervention. Patient remains asymptomatic, resting comfortably in the bed and denies any chest discomfort.

## 2024-05-13 NOTE — Progress Notes (Signed)
" °  Transition of Care [facility]) Screening Note   Patient Details  Name: Wayne Alvarez Date of Birth: January 19, 1951   Transition of Care [facility]) CM/SW Contact:    Hoy DELENA Bigness, LCSW Phone Number: 05/13/2024, 10:39 AM    Transition of Care Department [facility]) has reviewed patient and no TOC needs have been identified at this time. We will continue to monitor patient advancement through interdisciplinary progression rounds. If new patient transition needs arise, please place a TOC consult.    05/13/24 1038  TOC Brief Assessment  Insurance and Status Reviewed  Patient has primary care physician Yes  Home environment has been reviewed From home w/ spouse  Prior level of function: Independent  Prior/Current Home Services No current home services  Social Drivers of Health Review SDOH reviewed no interventions necessary  Readmission risk has been reviewed Yes  Transition of care needs transition of care needs identified, TOC will continue to follow    "

## 2024-05-13 NOTE — Progress Notes (Signed)
 Patient is wanting to turn over bank card to security in order for them to hold it until daughter Kingsley) comes and picks it up. Daughter phone number: [phone].

## 2024-05-14 ENCOUNTER — Inpatient Hospital Stay (HOSPITAL_COMMUNITY)

## 2024-05-14 DIAGNOSIS — E876 Hypokalemia: Secondary | ICD-10-CM | POA: Insufficient documentation

## 2024-05-14 DIAGNOSIS — R000 Tachycardia, unspecified: Principal | ICD-10-CM | POA: Insufficient documentation

## 2024-05-14 DIAGNOSIS — I4892 Unspecified atrial flutter: Secondary | ICD-10-CM | POA: Diagnosis not present

## 2024-05-14 DIAGNOSIS — J9601 Acute respiratory failure with hypoxia: Secondary | ICD-10-CM | POA: Insufficient documentation

## 2024-05-14 DIAGNOSIS — N179 Acute kidney failure, unspecified: Secondary | ICD-10-CM

## 2024-05-14 DIAGNOSIS — I5041 Acute combined systolic (congestive) and diastolic (congestive) heart failure: Secondary | ICD-10-CM

## 2024-05-14 LAB — BASIC METABOLIC PANEL WITH GFR
Anion gap: 14 (ref 5–15)
Anion gap: 15 (ref 5–15)
BUN: 29 mg/dL — ABNORMAL HIGH (ref 8–23)
BUN: 32 mg/dL — ABNORMAL HIGH (ref 8–23)
CO2: 17 mmol/L — ABNORMAL LOW (ref 22–32)
CO2: 19 mmol/L — ABNORMAL LOW (ref 22–32)
Calcium: 8 mg/dL — ABNORMAL LOW (ref 8.9–10.3)
Calcium: 8.1 mg/dL — ABNORMAL LOW (ref 8.9–10.3)
Chloride: 102 mmol/L (ref 98–111)
Chloride: 100 mmol/L (ref 98–111)
Creatinine, Ser: 2.57 mg/dL — ABNORMAL HIGH (ref 0.61–1.24)
Creatinine, Ser: 2.77 mg/dL — ABNORMAL HIGH (ref 0.61–1.24)
GFR, Estimated: 26 mL/min — ABNORMAL LOW
GFR, Estimated: 23 mL/min — ABNORMAL LOW
Glucose, Bld: 181 mg/dL — ABNORMAL HIGH (ref 70–99)
Glucose, Bld: 155 mg/dL — ABNORMAL HIGH (ref 70–99)
Potassium: 5.7 mmol/L — ABNORMAL HIGH (ref 3.5–5.1)
Potassium: 5.3 mmol/L — ABNORMAL HIGH (ref 3.5–5.1)
Sodium: 133 mmol/L — ABNORMAL LOW (ref 135–145)
Sodium: 134 mmol/L — ABNORMAL LOW (ref 135–145)

## 2024-05-14 MED ORDER — POTASSIUM CHLORIDE CRYS ER 20 MEQ PO TBCR: 40.0000 meq | EXTENDED_RELEASE_TABLET | Freq: Once | ORAL | Status: DC

## 2024-05-14 MED ORDER — BISMUTH SUBSALICYLATE 262 MG PO CHEW
2.0000 | CHEWABLE_TABLET | ORAL | Status: DC | PRN
  Administered 2024-05-14: 524 mg via ORAL
  Filled 2024-05-14 (×2): qty 2

## 2024-05-14 MED ORDER — FUROSEMIDE 10 MG/ML IJ SOLN: 40.0000 mg | Freq: Once | INTRAMUSCULAR | Status: DC

## 2024-05-14 MED ORDER — SODIUM ZIRCONIUM CYCLOSILICATE 10 G PO PACK
10.0000 g | PACK | Freq: Once | ORAL | Status: AC
  Administered 2024-05-14: 10 g via ORAL
  Filled 2024-05-14: qty 1

## 2024-05-14 MED ORDER — METOPROLOL TARTRATE 25 MG PO TABS
25.0000 mg | ORAL_TABLET | Freq: Three times a day (TID) | ORAL | Status: DC
  Administered 2024-05-14 – 2024-05-15 (×3): 25 mg via ORAL
  Filled 2024-05-14 (×6): qty 1

## 2024-05-14 NOTE — Plan of Care (Signed)
" °  Problem: Clinical Measurements: Goal: Ability to maintain clinical measurements within normal limits will improve Outcome: Progressing   Problem: Clinical Measurements: Goal: Will remain free from infection Outcome: Progr Problem: Clinical Measurements: Goal: Diagnostic test results will improve Outcome: Progressing   Problem: Clinical Measurements: Goal: Respiratory complications will improve Outcome: Progressing   Problem: Clinical Measurements: Goal: Cardiovascular complication will be avoided Outcome: Progressing   Problem: Activity: Goal: Risk for activity intolerance will decrease Outcome: Progressing   Problem: Nutrition: Goal: Adequate nutrition will be maintained Outcome: Progressing   Problem: Pain Managment: Goal: General experience of comfort will improve and/or be controlled Outcome: Progressing   Problem: Elimination: Goal: Will not experience complications related to urinary retention Outcome: Progressing   Problem: Elimination: Goal: Will not experience complications related to bowel motility Outcome: Progressing     "
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
## 2024-05-14 NOTE — Progress Notes (Addendum)
"  °  Progress Note  Patient Name: Wayne Alvarez Date of Encounter: 05/14/2024 [facility] Cardiologist: Alvan Carrier, MD   Interval Summary   Doing well this morning  Reports feeling much better Good UOP per patient yesterday  Awaiting AM BMP  Vital Signs Vitals:   05/14/24 0352 05/14/24 0700 05/14/24 0731 05/14/24 0800  BP: 112/73  109/82   Pulse: 68 (!) 57 (!) 58 (!) 53  Resp: 18     Temp: 98 F (36.7 C)  97.8 F (36.6 C)   TempSrc: Oral  Oral   SpO2: 90% 93% 95% 93%  Weight:      Height:        Intake/Output Summary (Last 24 hours) at 05/14/2024 0939 Last data filed at 05/14/2024 0730 Gross per 24 hour  Intake 1055.22 ml  Output 950 ml  Net 105.22 ml      05/13/2024    5:46 PM 05/12/2024   10:00 PM 05/12/2024    3:23 PM  Last 3 Weights  Weight (lbs) 172 lb 8 oz 177 lb 0.5 oz 170 lb  Weight (kg) 78.245 kg 80.3 kg 77.111 kg     Telemetry/ECG  Sinus, HR 60s - Personally Reviewed  Physical Exam  GEN: No acute distress, remains on [place].   Neck: No JVD Cardiac: RRR, no murmurs, rubs, or gallops.  Respiratory: Clear to auscultation bilaterally, expiratory wheezing. GI: Soft, nontender, non-distended  MS: No edema  Assessment & Plan   Acute HFrEF, EF 20-25% (prior 55-60%) Hypertension  Moderate MR Echo this admission: LVEF 20-25%, GK, mild RV systolic dysfunction, moderate MR Echo from 11/2021: LVEF 55-60%, normal RV function, mild BAE, trivial MR proBNP elevated at 1,799  CXR showed pulmonary edema, stable cardiomegaly, possible left PTX Creatinine 1.68 ? 1.33 ? 2.57 Given IV Lasix  80 mg x 2 doses  Does not appear significantly volume overloaded on exam BP has been running low-normal with rates 50-60s Discontinue Lasix  given renal function and appears fairly euvolemic  Presently on Lopressor  25 mg Q6h, rates have been in the 50-60s, will decrease to Q8h, can further decrease to BID if HR remains in the 60s  Depending on renal function can add low  dose ARB/MRA once renal function recovers and if BP can tolerate  Given reduced EF patient will likely require R/LHC this admission, will discuss timing with MD -- likely done next week to allow renal function to normalize    Wide complex tachycardia Atrial tachycardia 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 AVNRT with aberrancy Started on IV amiodarone  and scheduled Lopressor  Presently on Lopressor  25 mg Q6h, rates have been in the 50-60s, will decrease to Q8h Presently on IV amiodarone   EP team to see today, will follow their recs    For questions or updates, please contact [facility] Please consult [email or web address] for contact info under    Signed, Waddell DELENA Donath, PA-C   "

## 2024-05-14 NOTE — Progress Notes (Signed)
 [facility] PROGRESS NOTE    Progress Note  Wayne Alvarez  FMW:[id number] DOB: 03/01/1951 DOA: 05/12/2024 PCP: Shona Norleen PEDLAR, MD     Brief Narrative:   Wayne Alvarez is an 74 y.o. male past medical history of A-fib a flutter on Eliquis  comes into the ED for palpitations while cleaning his home, came into the ED was found tachycardic twelve-lead EKG showed atrial flutter with wide-complex tachycardia heart rate of 252, he was cardioverted in the ED return to sinus rhythm with APCs, but went into SVT started on amiodarone  drip  Assessment/Plan:   Atrial flutter/atrial tach with RVR with wide-complex tachycardia: He underwent DCCV in the ED and returned to sinus rhythm with PACs. Later developed atrial flutter versus atrial tach. Cardiology who was concerned about atrial flutter with SVT with aberrancy. Started on IV amiodarone  and oral Lopressor . 2D echo was done that showed an EF of 20%, with mild RV dysfunction And also recommended transfer to [facility] for EP evaluation for concern of atrial tach versus possible AVNRT with aberrancy. Continue Eliquis .  History of paroxysmal atrial flutter (HCC): Continue Toprol  and Eliquis  for CVA prevention.  Acute respiratory failure with hypoxia likely due to acute systolic heart failure/acute pulmonary edema: Started on IV Lasix , BiPAP as needed. Suspect fluid buildup is likely due to tachyarrhythmia. Was also started on IV steroids due to his long history of tobacco abuse. Still appears fluid overloaded on physical exam crackles at bases with JVD, check a basic metabolic panel. Might need further IV diuresis. OOB, strict I's and O's, daily weight.  Acute COPD exacerbation: Agree with IV steroids as he is wheezing on physical exam.  Acute kidney injury: Likely hemodynamically mediated back in 2023, creatinine was 1.8. On admission 1.7 with IV diuresis, basic metabolic panels pending.  GERD: Continue  PPI.  Hypokalemia: Try to keep potassium greater than 4 magnesium greater than 2.    DVT prophylaxis: Eliquis  Family Communication:none Status is: Inpatient Remains inpatient appropriate because: Tachyarrhythmia    Code Status:     Code Status Orders  (From admission, onward)           Start     Ordered   05/12/24 2120  Full code  Continuous       Question:  By:  Answer:  Consent: discussion documented in EHR   05/12/24 2120           Code Status History     Date Active Date Inactive Code Status Order ID Comments User Context   05/29/2013 1650 05/30/2013 1449 Full Code [id number]  Burnetta Aures, MD Inpatient         IV Access:   Peripheral IV   Procedures and diagnostic studies:   ECHOCARDIOGRAM COMPLETE Result Date: 05/13/2024    ECHOCARDIOGRAM REPORT   Patient Name:   Wayne Alvarez Date of Exam: 05/13/2024 Medical Rec #:  [id number]       Height:       71.0 in Accession #:    [id number]      Weight:       177.0 lb Date of Birth:  Jun 02, 1950        BSA:          2.002 m Patient Age:    73 years        BP:           123/81 mmHg Patient Gender: M  HR:           72 bpm. Exam Location:  [facility] Procedure: 2D Echo, Cardiac Doppler, Color Doppler and Intracardiac            Opacification Agent (Both Spectral and Color Flow Doppler were            utilized during procedure). Indications:    Atrial Flutter I48.92  History:        Patient has prior history of Echocardiogram examinations, most                 recent 12/05/2021. Arrythmias:Atrial Fibrillation; Risk                 Factors:Hypertension.  Sonographer:    Jayson Gaskins Referring Phys: [id number] Wayne Alvarez IMPRESSIONS  1. Left ventricular ejection fraction, by estimation, is 20 to 25%. The left ventricle has severely decreased function. The left ventricle demonstrates global hypokinesis. Left ventricular diastolic parameters are indeterminate.  2. RV not well visualized, grossly appears to be at  least mild systolic dysfunction. Right ventricular systolic function was not well visualized. The right ventricular size is not well visualized.  3. The mitral valve is abnormal. Moderate mitral valve regurgitation. No evidence of mitral stenosis.  4. The aortic valve is grossly normal. Aortic valve regurgitation is not visualized. No aortic stenosis is present. Conclusion(s)/Recommendation(s): No left ventricular mural or apical thrombus/thrombi. FINDINGS  Left Ventricle: Left ventricular ejection fraction, by estimation, is 20 to 25%. The left ventricle has severely decreased function. The left ventricle demonstrates global hypokinesis. Definity  contrast agent was given IV to delineate the left ventricular endocardial borders. The left ventricular internal cavity size was normal in size. There is borderline left ventricular hypertrophy. Left ventricular diastolic parameters are indeterminate. Right Ventricle: RV not well visualized, grossly appears to be at least mild systolic dysfunction. The right ventricular size is not well visualized. Right vetricular wall thickness was not well visualized. Right ventricular systolic function was not well visualized. Left Atrium: Left atrial size was normal in size. Right Atrium: Right atrial size was normal in size. Pericardium: There is no evidence of pericardial effusion. Mitral Valve: The mitral valve is abnormal. Moderate mitral valve regurgitation. No evidence of mitral valve stenosis. Tricuspid Valve: The tricuspid valve is grossly normal. Tricuspid valve regurgitation is trivial. Aortic Valve: The aortic valve is grossly normal. Aortic valve regurgitation is not visualized. No aortic stenosis is present. Aortic valve mean gradient measures 1.0 mmHg. Aortic valve peak gradient measures 1.6 mmHg. Aortic valve area, by VTI measures 2.51 cm. Pulmonic Valve: The pulmonic valve was not well visualized. Pulmonic valve regurgitation is not visualized. No evidence of pulmonic  stenosis. Aorta: The aortic root and ascending aorta are structurally normal, with no evidence of dilitation. Venous: The inferior vena cava was not well visualized. IAS/Shunts: The interatrial septum was not well visualized.  LEFT VENTRICLE PLAX 2D LVIDd:         5.50 cm LVIDs:         4.60 cm LV PW:         1.00 cm LV IVS:        0.90 cm LVOT diam:     1.90 cm LV SV:         29 LV SV Index:   14 LVOT Area:     2.84 cm  RIGHT VENTRICLE RV S prime:     8.16 cm/s TAPSE (M-mode): 1.2 cm LEFT ATRIUM  Index        RIGHT ATRIUM           Index LA Vol (A2C):   75.1 ml 37.51 ml/m  RA Area:     20.70 cm LA Vol (A4C):   37.4 ml 18.68 ml/m  RA Volume:   57.10 ml  28.52 ml/m LA Biplane Vol: 60.7 ml 30.32 ml/m  AORTIC VALVE AV Area (Vmax):    2.40 cm AV Area (Vmean):   1.99 cm AV Area (VTI):     2.51 cm AV Vmax:           62.70 cm/s AV Vmean:          50.500 cm/s AV VTI:            0.115 m AV Peak Grad:      1.6 mmHg AV Mean Grad:      1.0 mmHg LVOT Vmax:         53.10 cm/s LVOT Vmean:        35.500 cm/s LVOT VTI:          0.102 m LVOT/AV VTI ratio: 0.89  AORTA Ao Root diam: 3.00 cm MITRAL VALVE                TRICUSPID VALVE MV Area (PHT): 4.49 cm     TR Peak grad:   2.6 mmHg MV Decel Time: 169 msec     TR Vmax:        80.10 cm/s MV E velocity: 101.00 cm/s                             SHUNTS                             Systemic VTI:  0.10 m                             Systemic Diam: 1.90 cm Wayne Ross MD Electronically signed by Wayne Ross MD Signature Date/Time: 05/13/2024/2:36:14 PM    Final    DG CHEST PORT 1 VIEW Result Date: 05/13/2024 EXAM: 1 VIEW(S) XRAY OF THE CHEST 05/13/2024 10:58:28 AM COMPARISON: 05/12/2024 CLINICAL HISTORY: Shortness of breath. FINDINGS: LINES, TUBES AND DEVICES: Defibrillator pads noted. LUNGS AND PLEURA: Trace left pleural effusion. Increased diffuse interstitial opacities with Kerley B-lines. Hazy right lower lung opacity. There is a new lucency along the left  lateral costophrenic angle. The most likely differential diagnosis for this lucency includes a small pneumothorax, pleural bleb/bulla, or artifact. HEART AND MEDIASTINUM: Cardiomegaly, unchanged. BONES AND SOFT TISSUES: No acute osseous abnormality. IMPRESSION: 1. Findings most consistent with pulmonary edema. 2. New lucency along the left lateral costophrenic angle, most likely a small left pneumothorax (differential includes skin fold or other artifact); recommend repeat upright chest radiograph with expiratory and/or left lateral decubitus views, or CT chest if equivocal. 3. Trace left pleural effusion. 4. Cardiomegaly, unchanged. 5. The urgent finding will be called to the ordering provider by the Professional Radiology Assistants (PRAs) and documented in the [facility] dashboard. Electronically signed by: Waddell Calk MD 05/13/2024 11:48 AM EST RP Workstation: [id number]   DG Chest Port 1 View Result Date: 05/12/2024 CLINICAL DATA:  cp Per triage Pt with feeling like heart racing since yesterday. Also feels like throat is closing. Pt states chest pressure EXAM: PORTABLE CHEST 1 VIEW COMPARISON:  CHEST CALF, 09/18/2021.  CT CHEST, 11/13/2022. FINDINGS: Mild burden of aortic arch atherosclerosis. Cardiomegaly. Lungs are well inflated. Retrocardiac opacity. No additional focal consolidation. No pleural effusion or pneumothorax. Overlying cutaneous pacers. No acute displaced fracture. IMPRESSION: Cardiomegaly.  No acute superimposed cardiopulmonary process. Electronically Signed   By: Thom Hall M.D.   On: 05/12/2024 16:34     Medical Consultants:   None.   Subjective:    Wayne Alvarez relates he feels better this morning.  Objective:    Vitals:   05/13/24 2217 05/13/24 2338 05/14/24 0008 05/14/24 0352  BP: 104/72  109/79 112/73  Pulse: 62 70 63 68  Resp: 19 18 19 18   Temp: 97.6 F (36.4 C)  97.8 F (36.6 C) 98 F (36.7 C)  TempSrc: Oral  Oral Oral  SpO2: 93% 95% 95% 90%  Weight:       Height:       SpO2: 90 % O2 Flow Rate (L/min): 3 L/min FiO2 (%): 60 %   Intake/Output Summary (Last 24 hours) at 05/14/2024 9287 Last data filed at 05/13/2024 1525 Gross per 24 hour  Intake 495.7 ml  Output 950 ml  Net -454.3 ml   Filed Weights   05/12/24 1523 05/12/24 2200 05/13/24 1746  Weight: 77.1 kg 80.3 kg 78.2 kg    Exam: General exam: In no acute distress. Respiratory system: Good air movement and crackles at bases with wheezing bilaterally Cardiovascular system: S1 & S2 heard, RRR.  Positive JVD Gastrointestinal system: Abdomen is nondistended, soft and nontender.  Extremities: No pedal edema. Skin: No rashes, lesions or ulcers Psychiatry: Judgement and insight appear normal. Mood & affect appropriate.    Data Reviewed:    Labs: Basic Metabolic Panel: Recent Labs  Lab 05/12/24 1537 05/13/24 0456  NA 139 139  K 4.1 3.7  CL 104 108  CO2 20* 20*  GLUCOSE 137* 116*  BUN 23 23  CREATININE 1.68* 1.33*  CALCIUM 8.5* 7.9*  MG 2.1 2.1  PHOS  --  3.0   GFR Estimated Creatinine Clearance: 52.7 mL/min (A) (by C-G formula based on SCr of 1.33 mg/dL (H)). Liver Function Tests: Recent Labs  Lab 05/13/24 0456  AST 41  ALT 39  ALKPHOS 59  BILITOT 0.3  PROT 5.9*  ALBUMIN 3.9   No results for input(s): LIPASE, AMYLASE in the last 168 hours. No results for input(s): AMMONIA in the last 168 hours. Coagulation profile No results for input(s): INR, PROTIME in the last 168 hours. COVID-19 Labs  No results for input(s): DDIMER, FERRITIN, LDH, CRP in the last 72 hours.  Lab Results  Component Value Date   SARSCOV2NAA Not Detected 05/13/2019    CBC: Recent Labs  Lab 05/12/24 1537 05/13/24 0456  WBC 9.1 8.4  HGB 15.9 14.0  HCT 47.6 41.9  MCV 94.1 92.9  PLT 191 203   Cardiac Enzymes: No results for input(s): CKTOTAL, CKMB, CKMBINDEX, TROPONINI in the last 168 hours. BNP (last 3 results) Recent Labs    05/13/24 0456   PROBNP 1,799.0*   CBG: Recent Labs  Lab 05/12/24 2203  GLUCAP 105*   D-Dimer: No results for input(s): DDIMER in the last 72 hours. Hgb A1c: No results for input(s): HGBA1C in the last 72 hours. Lipid Profile: No results for input(s): CHOL, HDL, LDLCALC, TRIG, CHOLHDL, LDLDIRECT in the last 72 hours. Thyroid function studies: Recent Labs    05/12/24 1536  TSH 1.520   Anemia work up: No results for input(s): VITAMINB12, FOLATE,  FERRITIN, TIBC, IRON, RETICCTPCT in the last 72 hours. Sepsis Labs: Recent Labs  Lab 05/12/24 1537 05/13/24 0456  WBC 9.1 8.4   Microbiology Recent Results (from the past 240 hours)  MRSA Next Gen by PCR, Nasal     Status: None   Collection Time: 05/12/24 10:05 PM   Specimen: Nasal Mucosa; Nasal Swab  Result Value Ref Range Status   MRSA by PCR Next Gen NOT DETECTED NOT DETECTED Final    Comment: (NOTE) The GeneXpert MRSA Assay (FDA approved for NASAL specimens only), is one component of a comprehensive MRSA colonization surveillance program. It is not intended to diagnose MRSA infection nor to guide or monitor treatment for MRSA infections. Test performance is not FDA approved in patients less than 63 years old. Performed at [facility], [address], [place], [place] [address]   Respiratory (~20 pathogens) panel by PCR     Status: None   Collection Time: 05/13/24 12:58 PM   Specimen: Nasopharyngeal Swab; Respiratory  Result Value Ref Range Status   Adenovirus NOT DETECTED NOT DETECTED Final   Coronavirus 229E NOT DETECTED NOT DETECTED Final    Comment: (NOTE) The Coronavirus on the Respiratory Panel, DOES NOT test for the novel  Coronavirus (2019 nCoV)    Coronavirus HKU1 NOT DETECTED NOT DETECTED Final   Coronavirus NL63 NOT DETECTED NOT DETECTED Final   Coronavirus OC43 NOT DETECTED NOT DETECTED Final   Metapneumovirus NOT DETECTED NOT DETECTED Final   Rhinovirus / Enterovirus NOT DETECTED NOT  DETECTED Final   Influenza A NOT DETECTED NOT DETECTED Final   Influenza B NOT DETECTED NOT DETECTED Final   Parainfluenza Virus 1 NOT DETECTED NOT DETECTED Final   Parainfluenza Virus 2 NOT DETECTED NOT DETECTED Final   Parainfluenza Virus 3 NOT DETECTED NOT DETECTED Final   Parainfluenza Virus 4 NOT DETECTED NOT DETECTED Final   Respiratory Syncytial Virus NOT DETECTED NOT DETECTED Final   Bordetella pertussis NOT DETECTED NOT DETECTED Final   Bordetella Parapertussis NOT DETECTED NOT DETECTED Final   Chlamydophila pneumoniae NOT DETECTED NOT DETECTED Final   Mycoplasma pneumoniae NOT DETECTED NOT DETECTED Final    Comment: Performed at [facility], [address] [place], [place] [address]     Medications:    apixaban   5 mg Oral BID   Chlorhexidine  Gluconate Cloth  6 each Topical Daily   dextromethorphan -guaiFENesin   1 tablet Oral BID   methylPREDNISolone  (SOLU-MEDROL ) injection  40 mg Intravenous Q12H   metoprolol  tartrate  25 mg Oral QID   pantoprazole   40 mg Oral Daily   pneumococcal 20-valent conjugate vaccine  0.5 mL Intramuscular Tomorrow-1000   Continuous Infusions:  amiodarone  30 mg/hr (05/14/24 0250)      LOS: 1 day   Erle Odell Castor  [facility]  05/14/2024, 7:12 AM

## 2024-05-14 NOTE — Plan of Care (Signed)

## 2024-05-15 ENCOUNTER — Telehealth: Payer: Self-pay | Admitting: Pulmonary Disease

## 2024-05-15 DIAGNOSIS — R002 Palpitations: Secondary | ICD-10-CM

## 2024-05-15 DIAGNOSIS — I5189 Other ill-defined heart diseases: Secondary | ICD-10-CM

## 2024-05-15 DIAGNOSIS — I4892 Unspecified atrial flutter: Secondary | ICD-10-CM

## 2024-05-15 LAB — BASIC METABOLIC PANEL WITH GFR
Anion gap: 16 — ABNORMAL HIGH (ref 5–15)
BUN: 47 mg/dL — ABNORMAL HIGH (ref 8–23)
CO2: 19 mmol/L — ABNORMAL LOW (ref 22–32)
Calcium: 8 mg/dL — ABNORMAL LOW (ref 8.9–10.3)
Chloride: 99 mmol/L (ref 98–111)
Creatinine, Ser: 2.87 mg/dL — ABNORMAL HIGH (ref 0.61–1.24)
GFR, Estimated: 22 mL/min — ABNORMAL LOW
Glucose, Bld: 147 mg/dL — ABNORMAL HIGH (ref 70–99)
Potassium: 5.1 mmol/L (ref 3.5–5.1)
Sodium: 134 mmol/L — ABNORMAL LOW (ref 135–145)

## 2024-05-15 MED ORDER — AMIODARONE HCL 200 MG PO TABS: 200.0000 mg | ORAL_TABLET | Freq: Two times a day (BID) | ORAL | Status: DC

## 2024-05-15 MED ORDER — SODIUM ZIRCONIUM CYCLOSILICATE 10 G PO PACK
10.0000 g | PACK | Freq: Once | ORAL | Status: AC
  Administered 2024-05-15: 10 g via ORAL
  Filled 2024-05-15: qty 1

## 2024-05-15 MED ORDER — AMIODARONE HCL 200 MG PO TABS: 200.0000 mg | ORAL_TABLET | Freq: Every day | ORAL | Status: DC

## 2024-05-15 MED ORDER — AMIODARONE HCL 200 MG PO TABS
400.0000 mg | ORAL_TABLET | Freq: Two times a day (BID) | ORAL | Status: DC
  Administered 2024-05-15 – 2024-05-16 (×3): 400 mg via ORAL
  Filled 2024-05-15 (×3): qty 2

## 2024-05-15 MED ORDER — PREDNISONE 20 MG PO TABS
30.0000 mg | ORAL_TABLET | Freq: Every day | ORAL | Status: DC
  Administered 2024-05-15 – 2024-05-16 (×2): 30 mg via ORAL
  Filled 2024-05-15 (×2): qty 1

## 2024-05-15 NOTE — Plan of Care (Signed)
" °  Problem: Clinical Measurements: Goal: Will remain free from infection Outcome: Progressing   Problem: Clinical Measurements: Goal: Ability to maintain clinical measurements within normal limits will improve Outcome: Progressing   Problem: Clinical Measurements: Goal: Diagnostic test results will improve Outcome: Progressing   Problem: Clinical Measurements: Goal: Respiratory complications will improve Outcome: Progressing   Problem: Clinical Measurements: Goal: Cardiovascular complication will be avoided Outcome: Progressing   Problem: Activity: Goal: Risk for activity intolerance will decrease Outcome: Progressing   Problem: Elimination: Goal: Will not experience complications related to bowel motility Outcome: Progressing   Problem: Elimination: Goal: Will not experience complications related to urinary retention Outcome: Progressing   Problem: Pain Managment: Goal: General experience of comfort will improve and/or be controlled Outcome: Progressing   Problem: Safety: Goal: Ability to remain free from injury will improve Outcome: Progressing   Problem: Skin Integrity: Goal: Risk for impaired skin integrity will decrease Outcome: Progressing   "

## 2024-05-15 NOTE — Telephone Encounter (Signed)
 Patient currently admitted, may discharge over the weekend. To avoid delay in monitoring, will add live monitor now.  2 week duration of monitoring.  Dr. Almetta to read.    Daphne Barrack, NP-C, AGACNP-BC [facility] - Electrophysiology  05/15/2024, 8:33 AM

## 2024-05-15 NOTE — Plan of Care (Signed)

## 2024-05-15 NOTE — Progress Notes (Signed)
"  °  Patient Name: Wayne Alvarez Date of Encounter: 05/15/2024  Primary Cardiologist: Alvan Carrier, MD Electrophysiologist: None  Interval Summary   The patient is doing well today.  Reports he slept well overnight.  At this time, the patient denies chest pain, shortness of breath, or any new concerns.  Vital Signs    Vitals:   05/14/24 2146 05/14/24 2248 05/15/24 0119 05/15/24 0511  BP: 113/69  108/78 114/82  Pulse: 63 64 (!) 57 (!) 59  Resp:   18 20  Temp:   98.3 F (36.8 C) 98.1 F (36.7 C)  TempSrc:   Oral Oral  SpO2:  94% 96% 96%  Weight:      Height:        Intake/Output Summary (Last 24 hours) at 05/15/2024 0717 Last data filed at 05/15/2024 0511 Gross per 24 hour  Intake 466.48 ml  Output 125 ml  Net 341.48 ml   Filed Weights   05/12/24 1523 05/12/24 2200 05/13/24 1746  Weight: 77.1 kg 80.3 kg 78.2 kg    Physical Exam    GEN- pleasant adult male in NAD, alert and oriented  Lungs- Clear to ausculation bilaterally, normal work of breathing Cardiac- Regular rate and rhythm, no murmurs, rubs or gallops GI- soft, NT, ND, + BS Extremities- no clubbing or cyanosis. No edema  Telemetry    SR 60's, no episodes of AT/arrhythmia overnight  (personally reviewed)  Hospital Course    Wayne Alvarez is a 74 y.o. male with PMH of  AFL (09/2021, converted on IV cardizem), HTN admitted for 05/12/24 with chest discomfort and palpitations. On presentation, he was in a WCT at 252 bpm.  He was cardioverted with DCCV to NSR, then went into AT/SVT. Loaded on amiodarone  and rhythms resolved.  LVEF 20-25%.   Assessment & Plan    AFL with RVR  SVT  AT  HFrEF  -continue tele monitoring  -transition to PO amiodarone  400mg  BID x7 days, then 200 mg BID x7 days, then 200 daily -plan to discharge with 2 week ZIO  -plan to review with Cardiology > may be able to go home and come back as an outpatient vs inpatient over weekend to monitor renal function and LHC on Monday -suspect  AFL primary arrhythmia based on prior AFL cycle length -hopeful if arrhythmia suppressed, his EF will recover     For questions or updates, please contact [facility] Please consult [email or web address] for contact info under     Signed, Daphne Barrack, NP-C, AGACNP-BC [facility] - Electrophysiology  05/15/2024, 7:57 AM    "

## 2024-05-15 NOTE — Progress Notes (Signed)
"  °  Progress Note  Patient Name: Wayne Alvarez Date of Encounter: 05/15/2024 [facility] Cardiologist: Alvan Carrier, MD   Interval Summary   Patient doing well overnight, no acute complaints Renal function worsening today  Will discuss with team regarding timing of ischemic evaluation inpatient vs outpatient  Been wearing CPAP at night here and reports it makes him feel much better, never properly diagnosed with OSA in the past per patient   Vital Signs Vitals:   05/14/24 2146 05/14/24 2248 05/15/24 0119 05/15/24 0511  BP: 113/69  108/78 114/82  Pulse: 63 64 (!) 57 (!) 59  Resp:   18 20  Temp:   98.3 F (36.8 C) 98.1 F (36.7 C)  TempSrc:   Oral Oral  SpO2:  94% 96% 96%  Weight:      Height:        Intake/Output Summary (Last 24 hours) at 05/15/2024 0806 Last data filed at 05/15/2024 0511 Gross per 24 hour  Intake 166.48 ml  Output 125 ml  Net 41.48 ml      05/13/2024    5:46 PM 05/12/2024   10:00 PM 05/12/2024    3:23 PM  Last 3 Weights  Weight (lbs) 172 lb 8 oz 177 lb 0.5 oz 170 lb  Weight (kg) 78.245 kg 80.3 kg 77.111 kg      Telemetry/ECG  Sinus, HR 60s - Personally Reviewed  Physical Exam  GEN: No acute distress.   Neck: No JVD Cardiac: RRR, no murmurs, rubs, or gallops.  Respiratory: Clear to auscultation bilaterally. GI: Soft, nontender, non-distended  MS: No edema  Assessment & Plan   Acute HFrEF, EF 20-25% (prior 55-60%) Hypertension  Moderate MR Echo this admission: LVEF 20-25%, GK, mild RV systolic dysfunction, moderate MR Echo from 11/2021: LVEF 55-60%, normal RV function, mild BAE, trivial MR proBNP elevated at 1,799  CXR showed pulmonary edema, stable cardiomegaly, possible left PTX Creatinine 1.68 ? 1.33 ? 2.57 ? 2.87 Given IV Lasix  80 mg x 2 doses  Appears euvolemic on exam  BP has been low-normal and HR in the 50-60s Presently on Lopressor  25 mg TID -- consolidate at discharge  Given worsening renal function today, avoid  additional GDMT until kidneys recover  Given reduced EF patient will require R/LHC, given worsening renal function possible to have discussion to proceed with this as an outpatient    Wide complex tachycardia Possible Long RP Tachy vs A-Flutter Atrial Tachycardia While at [facility] EKG showed WCT with rate 250 bpm Underwent DCCV in ER  Converted to sinus then later showed runs of A-flutter vs A-tach Possible A-tach vs. AVNRT with aberrancy Started on IV amiodarone  and scheduled Lopressor  Presently on Lopressor  25 mg TID -- consolidate at discharge  Started on PO amiodarone  with loading dose per EP Continue Eliquis  5 mg BID Recommendations for EP include: ischemic evaluation but with worsening renal function consider cMRI for evaluate for scar burden and then consider R/LHC at an outpatient vs staying over the weekend for [facility] Monday  For questions or updates, please contact [facility] Please consult [email or web address] for contact info under   Signed, Waddell DELENA Donath, PA-C   "

## 2024-05-16 ENCOUNTER — Other Ambulatory Visit (HOSPITAL_COMMUNITY): Payer: Self-pay

## 2024-05-16 LAB — BASIC METABOLIC PANEL WITH GFR
Anion gap: 11 (ref 5–15)
BUN: 55 mg/dL — ABNORMAL HIGH (ref 8–23)
CO2: 24 mmol/L (ref 22–32)
Calcium: 8.1 mg/dL — ABNORMAL LOW (ref 8.9–10.3)
Chloride: 100 mmol/L (ref 98–111)
Creatinine, Ser: 2.26 mg/dL — ABNORMAL HIGH (ref 0.61–1.24)
GFR, Estimated: 30 mL/min — ABNORMAL LOW
Glucose, Bld: 104 mg/dL — ABNORMAL HIGH (ref 70–99)
Potassium: 4.7 mmol/L (ref 3.5–5.1)
Sodium: 136 mmol/L (ref 135–145)

## 2024-05-16 MED ORDER — PREDNISONE 10 MG PO TABS
ORAL_TABLET | ORAL | 0 refills | Status: DC
  Filled 2024-05-16: qty 3, 2d supply, fill #0

## 2024-05-16 MED ORDER — RIVAROXABAN 15 MG PO TABS
15.0000 mg | ORAL_TABLET | Freq: Every day | ORAL | 0 refills | Status: AC
  Filled 2024-05-16: qty 30, 30d supply, fill #0

## 2024-05-16 MED ORDER — METOPROLOL TARTRATE 25 MG PO TABS
25.0000 mg | ORAL_TABLET | Freq: Three times a day (TID) | ORAL | 1 refills | Status: AC
  Filled 2024-05-16: qty 66, 22d supply, fill #0
  Filled 2024-05-16: qty 24, 8d supply, fill #0

## 2024-05-16 MED ORDER — AMIODARONE HCL 200 MG PO TABS
ORAL_TABLET | ORAL | 1 refills | Status: AC
  Filled 2024-05-16: qty 60, 38d supply, fill #0

## 2024-05-16 MED ORDER — APIXABAN 5 MG PO TABS
5.0000 mg | ORAL_TABLET | Freq: Two times a day (BID) | ORAL | 2 refills | Status: DC
  Filled 2024-05-16: qty 60, 30d supply, fill #0

## 2024-05-16 NOTE — Plan of Care (Signed)
" °  Problem: Education: Goal: Knowledge of General Education information will improve Description: Including pain rating scale, medication(s)/side effects and non-pharmacologic comfort measures Outcome: Progressing   Problem: Health Behavior/Discharge Planning: Goal: Ability to manage health-related needs will improve Outcome: Progressing   Problem: Clinical Measurements: Goal: Diagnostic test results will improve Outcome: Progressing   Problem: Clinical Measurements: Goal: Respiratory complications will improve Outcome: Progressing   Problem: Activity: Goal: Risk for activity intolerance will decrease Outcome: Progressing   Problem: Nutrition: Goal: Adequate nutrition will be maintained Outcome: Progressing   Problem: Coping: Goal: Level of anxiety will decrease Outcome: Progressing   Problem: Safety: Goal: Ability to remain free from injury will improve Outcome: Progressing   Problem: Skin Integrity: Goal: Risk for impaired skin integrity will decrease Outcome: Progressing   "

## 2024-05-16 NOTE — Plan of Care (Signed)

## 2024-05-16 NOTE — Discharge Summary (Addendum)
 Physician Discharge Summary  Wayne Alvarez FMW:[id number] DOB: October 09, 1950 DOA: 05/12/2024  PCP: Shona Norleen PEDLAR, MD  Admit date: 05/12/2024 Discharge date: 05/16/2024  Admitted From: Home Disposition:  Home  Recommendations for Outpatient Follow-up:  Follow up with PCP in 1-2 weeks Please obtain BMP/CBC in one week   Home Health:No Equipment/Devices:None  Discharge Condition:Stable CODE STATUS:Full Diet recommendation: Heart Healthy  Brief/Interim Summary: 74 y.o. male past medical history of A-fib a flutter on Eliquis  comes into the ED for palpitations while cleaning his home, came into the ED was found tachycardic twelve-lead EKG showed atrial flutter with wide-complex tachycardia heart rate of 252, he was cardioverted in the ED return to sinus rhythm with APCs, but went into SVT started on amiodarone  drip   Discharge Diagnoses:  Principal Problem:   Paroxysmal atrial flutter (HCC) Active Problems:   Wide-complex tachycardia   Acute respiratory failure with hypoxia (HCC)   Hypokalemia   Acute combined systolic and diastolic heart failure (HCC)   AKI (acute kidney injury)   Left ventricular systolic dysfunction  Atrial flutter/atrial tach with RVR with wide-complex tachycardia: He underwent DCCV in the ED return to sinus rhythm with PAC. Loaded with amiodarone  and started on low pressors. Transfer to [facility] EP evaluated the patient. 2D echo was done that showed an EF of 20% with mild RV dysfunction. EP recommended ischemic evaluation once a 2D echo was repeated and his renal function is improved. He also get a cardiac MRI and probably left heart cath as an outpatient.  History of paroxysmal atrial flutter: Continue Toprol  and Eliquis .  Acute respiratory failure with hypoxia likely due to acute systolic heart failure/pulmonary edema: Needed BiPAP on admission. Suspect fluid buildup likely due to tachyarrhythmia. He was wheezing on physical exam and he was started on  steroids which will taper off as an outpatient. IV Lasix  was held due to significant diuresis. He creatinine stabilized we were able to wean him off oxygen He is not a candidate for ACE inhibitor at this point in time due to his renal dysfunction he will be further evaluated as an outpatient. Will follow-up with cardiology in 1 to 2 weeks.  Acute COPD exacerbation: Now has been weaned to room air continue steroid taper as an outpatient.  Acute kidney injury: Likely hemodynamic mediated, back in 2023 creatinine was 1.8. He was started on IV diuresis as he show pulmonary edema his creatinine bumped to 2.8. Lasix  was held as well as other antihypertensive medication. His creatinine started to improve he was discharged in stable condition repeat a basic metabolic panel next week.  GERD: Continue PPI.  Hyperkalemia: Improved with Lokelma .  Discharge Instructions  Discharge Instructions     Increase activity slowly   Complete by: As directed       Allergies as of 05/16/2024   No Known Allergies      Medication List     STOP taking these medications    apixaban  5 MG Tabs tablet Commonly known as: ELIQUIS    metoprolol  succinate 25 MG 24 hr tablet Commonly known as: TOPROL -XL   MULTIVITAMIN PO       TAKE these medications    acetaminophen  500 MG tablet Commonly known as: TYLENOL  Take 500-1,000 mg by mouth every 6 (six) hours as needed for moderate pain (pain score 4-6).   amiodarone  200 MG tablet Commonly known as: PACERONE  Take 2 tablets (400 mg total) by mouth 2 (two) times daily for 5 days, THEN 1 tablet (200 mg total) 2 (  two) times daily for 7 days, THEN 1 tablet (200 mg total) daily. Start taking on: May 16, 2024   atorvastatin 40 MG tablet Commonly known as: LIPITOR Take 40 mg by mouth daily.   fluticasone 50 MCG/ACT nasal spray Commonly known as: FLONASE Place 2 sprays into both nostrils daily as needed for allergies.   metoprolol  tartrate 25 MG  tablet Commonly known as: LOPRESSOR  Take 1 tablet (25 mg total) by mouth 3 (three) times daily.   nicotine polacrilex 4 MG gum Commonly known as: NICORETTE Take 4 mg by mouth as needed for smoking cessation.   predniSONE  10 MG tablet Commonly known as: DELTASONE  Takes  2 tabs for 1 days, then 1 tab for 1 days, and then stop.   primidone 50 MG tablet Commonly known as: MYSOLINE Take 50 mg by mouth daily.   Rivaroxaban  15 MG Tabs tablet Commonly known as: XARELTO  Take 1 tablet (15 mg total) by mouth daily with supper.   ZyrTEC Allergy 10 MG tablet Generic drug: cetirizine Take 10 mg by mouth daily as needed for allergies.        Allergies[1]  Consultations: Cardiology  electrophysiology   Procedures/Studies: DG Chest 2 View Result Date: 05/14/2024 CLINICAL DATA:  Shortness of breath. EXAM: CHEST - 2 VIEW COMPARISON:  None Available. FINDINGS: The cardiac silhouette is mildly enlarged and unchanged in size. There is mild bilateral infrahilar atelectasis and/or infiltrate. This is mildly decreased in severity when compared to the prior study. The interstitial opacities seen on the prior study are markedly decreased in severity. No pleural effusion or pneumothorax is identified. The visualized skeletal structures are unremarkable. IMPRESSION: 1. Stable cardiomegaly with very mild residual interstitial edema when compared to the prior study. 2. Mild bilateral infrahilar atelectasis and/or infiltrate, mildly decreased in severity when compared to the prior exam. Electronically Signed   By: Suzen Dials M.D.   On: 05/14/2024 16:30   ECHOCARDIOGRAM COMPLETE Result Date: 05/13/2024    ECHOCARDIOGRAM REPORT   Patient Name:   Wayne Alvarez Date of Exam: 05/13/2024 Medical Rec #:  [id number]       Height:       71.0 in Accession #:    [id number]      Weight:       177.0 lb Date of Birth:  [birth year]        BSA:          2.002 m Patient Age:    73 years        BP:           123/81 mmHg  Patient Gender: M               HR:           72 bpm. Exam Location:  [facility] Procedure: 2D Echo, Cardiac Doppler, Color Doppler and Intracardiac            Opacification Agent (Both Spectral and Color Flow Doppler were            utilized during procedure). Indications:    Atrial Flutter I48.92  History:        Patient has prior history of Echocardiogram examinations, most                 recent 12/05/2021. Arrythmias:Atrial Fibrillation; Risk                 Factors:Hypertension.  Sonographer:    Jayson Gaskins Referring Phys: [id number] LAYMON CHRISTELLA QUA IMPRESSIONS  1. Left ventricular ejection fraction, by estimation, is 20 to 25%. The left ventricle has severely decreased function. The left ventricle demonstrates global hypokinesis. Left ventricular diastolic parameters are indeterminate.  2. RV not well visualized, grossly appears to be at least mild systolic dysfunction. Right ventricular systolic function was not well visualized. The right ventricular size is not well visualized.  3. The mitral valve is abnormal. Moderate mitral valve regurgitation. No evidence of mitral stenosis.  4. The aortic valve is grossly normal. Aortic valve regurgitation is not visualized. No aortic stenosis is present. Conclusion(s)/Recommendation(s): No left ventricular mural or apical thrombus/thrombi. FINDINGS  Left Ventricle: Left ventricular ejection fraction, by estimation, is 20 to 25%. The left ventricle has severely decreased function. The left ventricle demonstrates global hypokinesis. Definity  contrast agent was given IV to delineate the left ventricular endocardial borders. The left ventricular internal cavity size was normal in size. There is borderline left ventricular hypertrophy. Left ventricular diastolic parameters are indeterminate. Right Ventricle: RV not well visualized, grossly appears to be at least mild systolic dysfunction. The right ventricular size is not well visualized. Right vetricular wall thickness  was not well visualized. Right ventricular systolic function was not well visualized. Left Atrium: Left atrial size was normal in size. Right Atrium: Right atrial size was normal in size. Pericardium: There is no evidence of pericardial effusion. Mitral Valve: The mitral valve is abnormal. Moderate mitral valve regurgitation. No evidence of mitral valve stenosis. Tricuspid Valve: The tricuspid valve is grossly normal. Tricuspid valve regurgitation is trivial. Aortic Valve: The aortic valve is grossly normal. Aortic valve regurgitation is not visualized. No aortic stenosis is present. Aortic valve mean gradient measures 1.0 mmHg. Aortic valve peak gradient measures 1.6 mmHg. Aortic valve area, by VTI measures 2.51 cm. Pulmonic Valve: The pulmonic valve was not well visualized. Pulmonic valve regurgitation is not visualized. No evidence of pulmonic stenosis. Aorta: The aortic root and ascending aorta are structurally normal, with no evidence of dilitation. Venous: The inferior vena cava was not well visualized. IAS/Shunts: The interatrial septum was not well visualized.  LEFT VENTRICLE PLAX 2D LVIDd:         5.50 cm LVIDs:         4.60 cm LV PW:         1.00 cm LV IVS:        0.90 cm LVOT diam:     1.90 cm LV SV:         29 LV SV Index:   14 LVOT Area:     2.84 cm  RIGHT VENTRICLE RV S prime:     8.16 cm/s TAPSE (M-mode): 1.2 cm LEFT ATRIUM             Index        RIGHT ATRIUM           Index LA Vol (A2C):   75.1 ml 37.51 ml/m  RA Area:     20.70 cm LA Vol (A4C):   37.4 ml 18.68 ml/m  RA Volume:   57.10 ml  28.52 ml/m LA Biplane Vol: 60.7 ml 30.32 ml/m  AORTIC VALVE AV Area (Vmax):    2.40 cm AV Area (Vmean):   1.99 cm AV Area (VTI):     2.51 cm AV Vmax:           62.70 cm/s AV Vmean:          50.500 cm/s AV VTI:  0.115 m AV Peak Grad:      1.6 mmHg AV Mean Grad:      1.0 mmHg LVOT Vmax:         53.10 cm/s LVOT Vmean:        35.500 cm/s LVOT VTI:          0.102 m LVOT/AV VTI ratio: 0.89  AORTA Ao  Root diam: 3.00 cm MITRAL VALVE                TRICUSPID VALVE MV Area (PHT): 4.49 cm     TR Peak grad:   2.6 mmHg MV Decel Time: 169 msec     TR Vmax:        80.10 cm/s MV E velocity: 101.00 cm/s                             SHUNTS                             Systemic VTI:  0.10 m                             Systemic Diam: 1.90 cm Dorn Ross MD Electronically signed by Dorn Ross MD Signature Date/Time: 05/13/2024/2:36:14 PM    Final    DG CHEST PORT 1 VIEW Result Date: 05/13/2024 EXAM: 1 VIEW(S) XRAY OF THE CHEST 05/13/2024 10:58:28 AM COMPARISON: 05/12/2024 CLINICAL HISTORY: Shortness of breath. FINDINGS: LINES, TUBES AND DEVICES: Defibrillator pads noted. LUNGS AND PLEURA: Trace left pleural effusion. Increased diffuse interstitial opacities with Kerley B-lines. Hazy right lower lung opacity. There is a new lucency along the left lateral costophrenic angle. The most likely differential diagnosis for this lucency includes a small pneumothorax, pleural bleb/bulla, or artifact. HEART AND MEDIASTINUM: Cardiomegaly, unchanged. BONES AND SOFT TISSUES: No acute osseous abnormality. IMPRESSION: 1. Findings most consistent with pulmonary edema. 2. New lucency along the left lateral costophrenic angle, most likely a small left pneumothorax (differential includes skin fold or other artifact); recommend repeat upright chest radiograph with expiratory and/or left lateral decubitus views, or CT chest if equivocal. 3. Trace left pleural effusion. 4. Cardiomegaly, unchanged. 5. The urgent finding will be called to the ordering provider by the Professional Radiology Assistants (PRAs) and documented in the [facility] dashboard. Electronically signed by: Waddell Calk MD 05/13/2024 11:48 AM EST RP Workstation: [id number]   DG Chest Port 1 View Result Date: 05/12/2024 CLINICAL DATA:  cp Per triage Pt with feeling like heart racing since yesterday. Also feels like throat is closing. Pt states chest pressure EXAM: PORTABLE  CHEST 1 VIEW COMPARISON:  CHEST CALF, 09/18/2021.  CT CHEST, 11/13/2022. FINDINGS: Mild burden of aortic arch atherosclerosis. Cardiomegaly. Lungs are well inflated. Retrocardiac opacity. No additional focal consolidation. No pleural effusion or pneumothorax. Overlying cutaneous pacers. No acute displaced fracture. IMPRESSION: Cardiomegaly.  No acute superimposed cardiopulmonary process. Electronically Signed   By: Thom Hall M.D.   On: 05/12/2024 16:34    Subjective: No complaints  Discharge Exam: Vitals:   05/16/24 0759 05/16/24 0802  BP: 122/71   Pulse: (!) 58 (!) 58  Resp: 16   Temp: 98.2 F (36.8 C)   SpO2: 95%    Vitals:   05/16/24 0036 05/16/24 0429 05/16/24 0759 05/16/24 0802  BP: 120/86 125/76 122/71   Pulse: (!) 59 (!) 56 (!) 58 (!) 58  Resp:  20 16 16    Temp: 98.2 F (36.8 C) 98 F (36.7 C) 98.2 F (36.8 C)   TempSrc: Oral Oral Oral   SpO2: 95% 96% 95%   Weight:      Height:        General: Pt is alert, awake, not in acute distress Cardiovascular: RRR, S1/S2 +, no rubs, no gallops Respiratory: CTA bilaterally, no wheezing, no rhonchi Abdominal: Soft, NT, ND, bowel sounds + Extremities: no edema, no cyanosis    The results of significant diagnostics from this hospitalization (including imaging, microbiology, ancillary and laboratory) are listed below for reference.     Microbiology: Recent Results (from the past 240 hours)  MRSA Next Gen by PCR, Nasal     Status: None   Collection Time: 05/12/24 10:05 PM   Specimen: Nasal Mucosa; Nasal Swab  Result Value Ref Range Status   MRSA by PCR Next Gen NOT DETECTED NOT DETECTED Final    Comment: (NOTE) The GeneXpert MRSA Assay (FDA approved for NASAL specimens only), is one component of a comprehensive MRSA colonization surveillance program. It is not intended to diagnose MRSA infection nor to guide or monitor treatment for MRSA infections. Test performance is not FDA approved in patients less than 44  years old. Performed at [facility], [address] [place], [place] [address]   Respiratory (~20 pathogens) panel by PCR     Status: None   Collection Time: 05/13/24 12:58 PM   Specimen: Nasopharyngeal Swab; Respiratory  Result Value Ref Range Status   Adenovirus NOT DETECTED NOT DETECTED Final   Coronavirus 229E NOT DETECTED NOT DETECTED Final    Comment: (NOTE) The Coronavirus on the Respiratory Panel, DOES NOT test for the novel  Coronavirus (2019 nCoV)    Coronavirus HKU1 NOT DETECTED NOT DETECTED Final   Coronavirus NL63 NOT DETECTED NOT DETECTED Final   Coronavirus OC43 NOT DETECTED NOT DETECTED Final   Metapneumovirus NOT DETECTED NOT DETECTED Final   Rhinovirus / Enterovirus NOT DETECTED NOT DETECTED Final   Influenza A NOT DETECTED NOT DETECTED Final   Influenza B NOT DETECTED NOT DETECTED Final   Parainfluenza Virus 1 NOT DETECTED NOT DETECTED Final   Parainfluenza Virus 2 NOT DETECTED NOT DETECTED Final   Parainfluenza Virus 3 NOT DETECTED NOT DETECTED Final   Parainfluenza Virus 4 NOT DETECTED NOT DETECTED Final   Respiratory Syncytial Virus NOT DETECTED NOT DETECTED Final   Bordetella pertussis NOT DETECTED NOT DETECTED Final   Bordetella Parapertussis NOT DETECTED NOT DETECTED Final   Chlamydophila pneumoniae NOT DETECTED NOT DETECTED Final   Mycoplasma pneumoniae NOT DETECTED NOT DETECTED Final    Comment: Performed at [facility], [address], [place], [place] [address]     Labs: BNP (last 3 results) No results for input(s): BNP in the last 8760 hours. Basic Metabolic Panel: Recent Labs  Lab 05/12/24 1537 05/13/24 0456 05/14/24 0736 05/14/24 1025 05/15/24 0426 05/16/24 0411  NA 139 139 133* 134* 134* 136  K 4.1 3.7 5.7* 5.3* 5.1 4.7  CL 104 108 102 100 99 100  CO2 20* 20* 17* 19* 19* 24  GLUCOSE 137* 116* 181* 155* 147* 104*  BUN 23 23 29* 32* 47* 55*  CREATININE 1.68* 1.33* 2.57* 2.77* 2.87* 2.26*  CALCIUM 8.5* 7.9* 8.0* 8.1* 8.0*  8.1*  MG 2.1 2.1  --   --   --   --   PHOS  --  3.0  --   --   --   --  Liver Function Tests: Recent Labs  Lab 05/13/24 0456  AST 41  ALT 39  ALKPHOS 59  BILITOT 0.3  PROT 5.9*  ALBUMIN 3.9   No results for input(s): LIPASE, AMYLASE in the last 168 hours. No results for input(s): AMMONIA in the last 168 hours. CBC: Recent Labs  Lab 05/12/24 1537 05/13/24 0456  WBC 9.1 8.4  HGB 15.9 14.0  HCT 47.6 41.9  MCV 94.1 92.9  PLT 191 203   Cardiac Enzymes: No results for input(s): CKTOTAL, CKMB, CKMBINDEX, TROPONINI in the last 168 hours. BNP: Invalid input(s): POCBNP CBG: Recent Labs  Lab 05/12/24 2203  GLUCAP 105*   D-Dimer No results for input(s): DDIMER in the last 72 hours. Hgb A1c No results for input(s): HGBA1C in the last 72 hours. Lipid Profile No results for input(s): CHOL, HDL, LDLCALC, TRIG, CHOLHDL, LDLDIRECT in the last 72 hours. Thyroid function studies No results for input(s): TSH, T4TOTAL, T3FREE, THYROIDAB in the last 72 hours.  Invalid input(s): FREET3 Anemia work up No results for input(s): VITAMINB12, FOLATE, FERRITIN, TIBC, IRON, RETICCTPCT in the last 72 hours. Urinalysis No results found for: COLORURINE, APPEARANCEUR, LABSPEC, PHURINE, GLUCOSEU, HGBUR, BILIRUBINUR, KETONESUR, PROTEINUR, UROBILINOGEN, NITRITE, LEUKOCYTESUR Sepsis Labs Recent Labs  Lab 05/12/24 1537 05/13/24 0456  WBC 9.1 8.4   Microbiology Recent Results (from the past 240 hours)  MRSA Next Gen by PCR, Nasal     Status: None   Collection Time: 05/12/24 10:05 PM   Specimen: Nasal Mucosa; Nasal Swab  Result Value Ref Range Status   MRSA by PCR Next Gen NOT DETECTED NOT DETECTED Final    Comment: (NOTE) The GeneXpert MRSA Assay (FDA approved for NASAL specimens only), is one component of a comprehensive MRSA colonization surveillance program. It is not intended to diagnose MRSA infection nor  to guide or monitor treatment for MRSA infections. Test performance is not FDA approved in patients less than 5 years old. Performed at [facility], [address] [place], [place] [address]   Respiratory (~20 pathogens) panel by PCR     Status: None   Collection Time: 05/13/24 12:58 PM   Specimen: Nasopharyngeal Swab; Respiratory  Result Value Ref Range Status   Adenovirus NOT DETECTED NOT DETECTED Final   Coronavirus 229E NOT DETECTED NOT DETECTED Final    Comment: (NOTE) The Coronavirus on the Respiratory Panel, DOES NOT test for the novel  Coronavirus (2019 nCoV)    Coronavirus HKU1 NOT DETECTED NOT DETECTED Final   Coronavirus NL63 NOT DETECTED NOT DETECTED Final   Coronavirus OC43 NOT DETECTED NOT DETECTED Final   Metapneumovirus NOT DETECTED NOT DETECTED Final   Rhinovirus / Enterovirus NOT DETECTED NOT DETECTED Final   Influenza A NOT DETECTED NOT DETECTED Final   Influenza B NOT DETECTED NOT DETECTED Final   Parainfluenza Virus 1 NOT DETECTED NOT DETECTED Final   Parainfluenza Virus 2 NOT DETECTED NOT DETECTED Final   Parainfluenza Virus 3 NOT DETECTED NOT DETECTED Final   Parainfluenza Virus 4 NOT DETECTED NOT DETECTED Final   Respiratory Syncytial Virus NOT DETECTED NOT DETECTED Final   Bordetella pertussis NOT DETECTED NOT DETECTED Final   Bordetella Parapertussis NOT DETECTED NOT DETECTED Final   Chlamydophila pneumoniae NOT DETECTED NOT DETECTED Final   Mycoplasma pneumoniae NOT DETECTED NOT DETECTED Final    Comment: Performed at [facility], [address], [place], [place] [address]     Time coordinating discharge: Over 35 minutes  SIGNED:   Erle Odell Castor, MD  [facility] 05/16/2024, 10:06 AM Pager   If 7PM-7AM, please contact night-coverage [email or web address] Password [email or web address]     [1] No Known Allergies

## 2024-05-17 ENCOUNTER — Other Ambulatory Visit (HOSPITAL_COMMUNITY): Payer: Self-pay

## 2024-05-18 ENCOUNTER — Encounter: Payer: Self-pay | Admitting: Physician Assistant

## 2024-05-18 ENCOUNTER — Ambulatory Visit

## 2024-05-18 ENCOUNTER — Telehealth: Payer: Self-pay | Admitting: Physician Assistant

## 2024-05-18 ENCOUNTER — Other Ambulatory Visit (HOSPITAL_COMMUNITY): Payer: Self-pay

## 2024-05-18 ENCOUNTER — Inpatient Hospital Stay: Admitting: Physician Assistant

## 2024-05-18 ENCOUNTER — Other Ambulatory Visit (HOSPITAL_COMMUNITY)
Admission: RE | Admit: 2024-05-18 | Discharge: 2024-05-18 | Disposition: A | Discharge status: Home / Self Care | Source: Ambulatory Visit | Attending: Physician Assistant | Admitting: Physician Assistant

## 2024-05-18 ENCOUNTER — Telehealth: Payer: Self-pay

## 2024-05-18 VITALS — BP 150/70 | HR 55 | Ht 71.0 in | Wt 177.0 lb

## 2024-05-18 DIAGNOSIS — N179 Acute kidney failure, unspecified: Secondary | ICD-10-CM

## 2024-05-18 DIAGNOSIS — F1090 Alcohol use, unspecified, uncomplicated: Secondary | ICD-10-CM

## 2024-05-18 DIAGNOSIS — R000 Tachycardia, unspecified: Secondary | ICD-10-CM | POA: Diagnosis not present

## 2024-05-18 DIAGNOSIS — I4892 Unspecified atrial flutter: Secondary | ICD-10-CM | POA: Diagnosis not present

## 2024-05-18 DIAGNOSIS — R9431 Abnormal electrocardiogram [ECG] [EKG]: Secondary | ICD-10-CM | POA: Diagnosis not present

## 2024-05-18 DIAGNOSIS — I5020 Unspecified systolic (congestive) heart failure: Secondary | ICD-10-CM | POA: Diagnosis not present

## 2024-05-18 DIAGNOSIS — R002 Palpitations: Secondary | ICD-10-CM

## 2024-05-18 DIAGNOSIS — Z720 Tobacco use: Secondary | ICD-10-CM | POA: Diagnosis not present

## 2024-05-18 LAB — BASIC METABOLIC PANEL WITH GFR
Anion gap: 13 (ref 5–15)
BUN: 34 mg/dL — ABNORMAL HIGH (ref 8–23)
CO2: 24 mmol/L (ref 22–32)
Calcium: 8.8 mg/dL — ABNORMAL LOW (ref 8.9–10.3)
Chloride: 103 mmol/L (ref 98–111)
Creatinine, Ser: 1.48 mg/dL — ABNORMAL HIGH (ref 0.61–1.24)
GFR, Estimated: 50 mL/min — ABNORMAL LOW
Glucose, Bld: 111 mg/dL — ABNORMAL HIGH (ref 70–99)
Potassium: 4 mmol/L (ref 3.5–5.1)
Sodium: 139 mmol/L (ref 135–145)

## 2024-05-18 LAB — CBC
HCT: 42.1 % (ref 39.0–52.0)
Hemoglobin: 14.1 g/dL (ref 13.0–17.0)
MCH: 31 pg (ref 26.0–34.0)
MCHC: 33.5 g/dL (ref 30.0–36.0)
MCV: 92.5 fL (ref 80.0–100.0)
Platelets: 174 10*3/uL (ref 150–400)
RBC: 4.55 MIL/uL (ref 4.22–5.81)
RDW: 16 % — ABNORMAL HIGH (ref 11.5–15.5)
WBC: 10.2 10*3/uL (ref 4.0–10.5)
nRBC: 0 % (ref 0.0–0.2)

## 2024-05-18 NOTE — Telephone Encounter (Signed)
 Checking percert on the following    HEART MONITOR

## 2024-05-18 NOTE — Patient Instructions (Signed)
 Medication Instructions:  Will make recommendation for medication changes based on lab work.   *If you need a refill on your cardiac medications before your next appointment, please call your pharmacy*  Lab Work: Order CBC and BMP.   If you have labs (blood work) drawn today and your tests are completely normal, you will receive your results only by: MyChart Message (if you have MyChart) OR A paper copy in the mail If you have any lab test that is abnormal or we need to change your treatment, we will call you to review the results.  Testing/Procedures: Heart monitor pending and already mailed.   Based on lab work, will determine further CV testing. Discussed cardiac MRI vs R/L cath.   Follow-Up: At [facility], you and your health needs are our priority.  As part of our continuing mission to provide you with exceptional heart care, our providers are all part of one team.  This team includes your primary Cardiologist (physician) and Advanced Practice Providers or APPs (Physician Assistants and Nurse Practitioners) who all work together to provide you with the care you need, when you need it.  Your next appointment:      Follow up with electrophysiology in 4-6 weeks  Follow up with [facility] 2-3 months  Provider:   You will see one of the following Advanced Practice Providers on your designated Care Team:   Brittany Strader, PA-C  [place], [place] Olivia [place], [place]        We recommend signing up for the patient portal called MyChart.  Sign up information is provided on this After Visit Summary.  MyChart is used to connect with patients for Virtual Visits (Telemedicine).  Patients are able to view lab/test results, encounter notes, upcoming appointments, etc.  Non-urgent messages can be sent to your provider as well.   To learn more about what you can do with MyChart, go to [email or web address].

## 2024-05-18 NOTE — Progress Notes (Unsigned)
 " Cardiology Office Note:  .   Date:  05/19/2024  ID:  Wayne Alvarez, DOB Sep 04, 1950, MRN [id number] PCP: Wayne Wayne PEDLAR, MD  [facility] Providers Cardiologist:  Alvan Carrier, MD {  History of Present Illness: .   Wayne Alvarez is a 74 y.o. male  with PMHx of paroxysmal atrial flutter, HTN, and recent hospitalization for atrial flutter with wide complex tachycardia and new HFrEF who reports to [facility] office for hospital follow up.   Pertinent cardiac medical history:  Paroxysmal atrial flutter (diagnosed during ED visit in 09/2021 and converted with IV Cardizem) EF 55 to 60% in 11/2021 Echo 04/2024 showed EF 20 to 25%, global hypokinesis, mild RV systolic dysfunction, moderate MV regurgitation  Recent hospitalization 1/27-31/2026 found to be in atrial flutter with wide-complex tachycardia, HR to 252.  He was cardioverted in the ED and returned to NSR with PACs.  He subsequently went into A. 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  Treated with IV Lasix .  Echo revealed new severe LV systolic dysfunction (EF ~20%) with mild RV dysfunction. Course complicated by AKI, limiting initiation of GDMT. EP recommended ischemic evaluation (cMRI vs R/LHC) once renal function improves and repeat echo is obtained. Continued on amiodarone  200 mg taper, Lipitor 40 mg daily, Lopressor  25 mg 3 times daily, and Xarelto  15 mg daily.  Today, he reports doing overall well since discharge, with one brief episode of mild chest pressure when getting out of his truck that resolved with rest. He denies shortness of breath, palpitations, syncope, presyncope, dizziness, orthopnea, PND, edema, significant weight changes, bleeding, or claudication. Reports medication compliance and is currently taking amiodarone  200 mg BID for 7 days, then transitioning to 200 mg daily. He remains active without limitation. He smokes 3 cigarettes daily, drinks 1-2 beers per week, and denies illicit  drug use.  ROS: 10 point review of system has been reviewed and considered negative except ones been listed in the HPI.   Studies Reviewed: Wayne Alvarez   EKG Interpretation Date/Time:  Monday May 18 2024 15:10:47 EST Ventricular Rate:  54 PR Interval:  124 QRS Duration:  112 QT Interval:  554 QTC Calculation: 525 R Axis:   -66  Text Interpretation: Sinus bradycardia with Premature atrial complexes Incomplete right bundle branch block Left anterior fascicular block Marked T-wave abnormality, consider inferolateral ischemia Prolonged QT When compared with ECG of 12-May-2024 21:21, PREVIOUS ECG IS PRESENT Confirmed by Wayne Alvarez ([id number]) on 05/18/2024 7:42:43 PM    ECHO IMPRESSIONS 04/2024  1. Left ventricular ejection fraction, by estimation, is 20 to 25%. The  left ventricle has severely decreased function. The left ventricle  demonstrates global hypokinesis. Left ventricular diastolic parameters are  indeterminate.   2. RV not well visualized, grossly appears to be at least mild systolic  dysfunction. Right ventricular systolic function was not well visualized.  The right ventricular size is not well visualized.   3. The mitral valve is abnormal. Moderate mitral valve regurgitation. No  evidence of mitral stenosis.   4. The aortic valve is grossly normal. Aortic valve regurgitation is not  visualized. No aortic stenosis is present.   Conclusion(s)/Recommendation(s): No left ventricular mural or apical  thrombus/thrombi.   CV Studies: Cardiac studies reviewed are outlined and summarized above. Otherwise please see EMR for full report.   Risk Assessment/Calculations:    CHA2DS2-VASc Score = 3  This indicates a 3.2% annual risk of stroke. The patient's score is based upon: CHF History:  1 HTN History: 1 Diabetes History: 0 Stroke History: 0 Vascular Disease History: 0 Age Score: 1 Gender Score: 0  Physical Exam:   VS:  BP (!) 150/70   Pulse (!) 55   Ht 5' 11 (1.803 m)    Wt 177 lb (80.3 kg)   SpO2 97%   BMI 24.69 kg/m    Wt Readings from Last 3 Encounters:  05/18/24 177 lb (80.3 kg)  05/13/24 172 lb 8 oz (78.2 kg)  12/01/21 172 lb 6.4 oz (78.2 kg)    GEN: Well nourished, well developed in no acute distress while sitting in chair.  NECK: No JVD; No carotid bruits CARDIAC: RRR, no murmurs, rubs, gallops RESPIRATORY:  Clear to auscultation without rales, wheezing or rhonchi  ABDOMEN: Soft, non-tender, non-distended EXTREMITIES:  No edema; No deformity   ASSESSMENT AND PLAN: .    Atrial flutter, unspecified type (HCC) Wide-complex tachycardia Recent hospitalization (1/27-1/31/2026): Presented with atrial flutter with wide-complex tachycardia (HR up to 252 bpm), underwent electrical cardioversion with return to NSR with PACs, then developed atrial tachycardia vs AVNRT with aberrancy and was started on an amiodarone  drip and Lopressor . EKG today shows sinus bradycardia with PAC and LAFB, HR 54 with new marked T wave inversion concerning for inferolateral ischemia.  Denies palpitations, dizziness, or chest discomfort. No recurrent dark stools or bleeding. CBC & BMP ordered and reviewed today -- CBC WNL and Cr improved to 1.44.  Continue on Lopressor  25 mg 3 times daily and Xarelto  15 mg.  Live Zio was mailed to patient home today via Mag office. Patient was notified and is aware to contact office if any issues with placing monitor.  Discussed the possibility of cMRI vs Cath based on renal function. Patient already agreed and consented during visit. Based on improved  Cr and new EKG changes, would recommend proceeding with outpatient cath. Discussed with Dr. Almetta and recommended to proceed with cath and based on cath may need to consider cMRI.  R/L Cath scheduled for 05/26/24, with at 0830 with Dr. Anner. Recommended to hold Xarelto  72 hours prior to procedure.  Per EP (Dr. Almetta): If LVEF does not recover, consider ILR placement, outpatient evaluation  for ICD after 90 days of optimized GDMT following coronary evaluation, and discontinuation of amiodarone  with consideration of ablation.  HFrEF (heart failure with reduced ejection fraction) (HCC) EF 55 to 60% in 11/2021 ECHO 04/2024 showed EF 20 to 25%, global hypokinesis, mild RV systolic dysfunction, moderate MV regurgitation.  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 Will plan for repeat limited echo to assess LV and RV function in 1 month post cath.  Asymptomatic and appears euvolemic on exam.  Continue on Lopressor  as above.  GDMT limited due to recent AKI. Encouraged low sodium diet, fluid restriction <2L, and daily weights.  Educated to contact our office for weight gain of 2 lbs overnight or 5 lbs in one week. ED precautions discussed.    AKI (acute kidney injury) Recent hospitalization with Cr 1.68 >1.33 >2.57 > 2.87 BMP ordered and reviewed today -- creatinine improved to 1.44. Continue to monitor.  Tobacco use Alcohol use Encourage cessation.  HTN  BP elevated in office 150/76 with repeat BP 150/70.  Continue Lopressor  as above. Can reassess after cath.   Informed Consent   Shared Decision Making/Informed Consent The risks [stroke (1 in 1000), death (1 in 1000), kidney failure [usually temporary] (1 in 500), bleeding (1 in 200), allergic reaction [possibly serious] (1 in 200)], benefits (diagnostic support and management  of coronary artery disease) and alternatives of a cardiac catheterization were discussed in detail with Wayne Alvarez and he is willing to proceed.     Dispo: EP for follow up in 4-6 week and follow up with cardiology  in 4 week after cath.   Signed, Lorette CINDERELLA Kapur, PA-C  "

## 2024-05-18 NOTE — Transitions of Care (Post Inpatient/ED Visit) (Signed)
" ° °  05/18/2024  Name: CYRIL WOODMANSEE MRN: [id number] DOB: [birth year]  Today's TOC FU Call Status: Today's TOC FU Call Status:: Unsuccessful Call (1st Attempt) Unsuccessful Call (1st Attempt) Date: 05/18/24  Attempted to reach the patient regarding the most recent Inpatient/ED visit.  Follow Up Plan: Additional outreach attempts will be made to reach the patient to complete the Transitions of Care (Post Inpatient/ED visit) call.   Jacqulene Huntley J. Corgan Mormile RN, MSN [facility], [facility] RN Care Manager Direct Dial: [phone]  Fax: [phone] Website: [email or web address]   "

## 2024-05-18 NOTE — Progress Notes (Unsigned)
 Enrolled patient for a 14 day Zio AT monitor to be mailed to patients home.

## 2024-05-19 ENCOUNTER — Encounter: Payer: Self-pay | Admitting: Physician Assistant

## 2024-05-19 ENCOUNTER — Telehealth: Payer: Self-pay | Admitting: Physician Assistant

## 2024-05-19 ENCOUNTER — Encounter: Payer: Self-pay | Admitting: *Deleted

## 2024-05-19 NOTE — Telephone Encounter (Signed)
 Patient scheduled for heart cath on 05/26/24, with at 0830 with Dr. Anner. Pt notified of date and time.

## 2024-05-20 ENCOUNTER — Telehealth: Payer: Self-pay

## 2024-05-20 NOTE — Transitions of Care (Post Inpatient/ED Visit) (Signed)
" ° °  05/20/2024  Name: Wayne Alvarez MRN: [id number] DOB: Sep 07, 1950  Today's TOC FU Call Status: Today's TOC FU Call Status:: Unsuccessful Call (2nd Attempt) Unsuccessful Call (2nd Attempt) Date: 05/20/24  Attempted to reach the patient regarding the most recent Inpatient/ED visit.  Follow Up Plan: Additional outreach attempts will be made to reach the patient to complete the Transitions of Care (Post Inpatient/ED visit) call.   Tracy Gerken J. Casanova Schurman RN, MSN [facility], [facility] RN Care Manager Direct Dial: [phone]  Fax: [phone] Website: [email or web address]   "

## 2024-05-26 ENCOUNTER — Ambulatory Visit (HOSPITAL_COMMUNITY): Admission: RE | Admit: 2024-05-26 | Admitting: Cardiology

## 2024-05-26 ENCOUNTER — Encounter (HOSPITAL_COMMUNITY): Admission: RE | Payer: Self-pay

## 2024-08-03 ENCOUNTER — Ambulatory Visit: Admitting: Physician Assistant
